# Patient Record
Sex: Male | Born: 1940 | Race: Black or African American | Hispanic: No | Smoking: Current every day smoker
Health system: Southern US, Community
[De-identification: ages and names within clinical notes are randomized; demographics above are authoritative.]

## PROBLEM LIST (undated history)

## (undated) DIAGNOSIS — I200 Unstable angina: Secondary | ICD-10-CM

## (undated) DIAGNOSIS — E785 Hyperlipidemia, unspecified: Secondary | ICD-10-CM

## (undated) DIAGNOSIS — Z9289 Personal history of other medical treatment: Secondary | ICD-10-CM

## (undated) DIAGNOSIS — K2211 Ulcer of esophagus with bleeding: Secondary | ICD-10-CM

## (undated) DIAGNOSIS — I10 Essential (primary) hypertension: Secondary | ICD-10-CM

## (undated) DIAGNOSIS — F17200 Nicotine dependence, unspecified, uncomplicated: Secondary | ICD-10-CM

## (undated) DIAGNOSIS — I25810 Atherosclerosis of coronary artery bypass graft(s) without angina pectoris: Secondary | ICD-10-CM

## (undated) DIAGNOSIS — C61 Malignant neoplasm of prostate: Secondary | ICD-10-CM

## (undated) DIAGNOSIS — N2581 Secondary hyperparathyroidism of renal origin: Secondary | ICD-10-CM

## (undated) DIAGNOSIS — J301 Allergic rhinitis due to pollen: Secondary | ICD-10-CM

## (undated) DIAGNOSIS — I739 Peripheral vascular disease, unspecified: Secondary | ICD-10-CM

## (undated) DIAGNOSIS — N183 Chronic kidney disease, stage 3 (moderate): Secondary | ICD-10-CM

## (undated) DIAGNOSIS — H9190 Unspecified hearing loss, unspecified ear: Secondary | ICD-10-CM

## (undated) HISTORY — DX: Unspecified hearing loss, unspecified ear: H91.90

## (undated) HISTORY — PX: CORONARY ARTERY BYPASS GRAFT: SHX141

## (undated) HISTORY — DX: Peripheral vascular disease, unspecified: I73.9

## (undated) HISTORY — DX: Hyperlipidemia, unspecified: E78.5

## (undated) HISTORY — DX: Malignant neoplasm of prostate: C61

## (undated) HISTORY — DX: Nicotine dependence, unspecified, uncomplicated: F17.200

## (undated) HISTORY — DX: Unstable angina: I20.0

## (undated) HISTORY — DX: Essential (primary) hypertension: I10

## (undated) HISTORY — DX: Secondary hyperparathyroidism of renal origin: N25.81

## (undated) HISTORY — DX: Atherosclerosis of coronary artery bypass graft(s) without angina pectoris: I25.810

## (undated) HISTORY — DX: Personal history of other medical treatment: Z92.89

## (undated) HISTORY — DX: Chronic kidney disease, stage 3 (moderate): N18.3

## (undated) HISTORY — DX: Ulcer of esophagus with bleeding: K22.11

## (undated) HISTORY — DX: Allergic rhinitis due to pollen: J30.1

---

## 2004-07-03 ENCOUNTER — Ambulatory Visit: Payer: Self-pay | Admitting: Family Medicine

## 2004-12-26 ENCOUNTER — Ambulatory Visit: Payer: Self-pay | Admitting: Family Medicine

## 2005-01-10 ENCOUNTER — Ambulatory Visit: Payer: Self-pay | Admitting: Family Medicine

## 2005-01-11 ENCOUNTER — Ambulatory Visit: Payer: Self-pay | Admitting: Family Medicine

## 2005-01-21 ENCOUNTER — Ambulatory Visit: Payer: Self-pay | Admitting: Family Medicine

## 2005-01-30 ENCOUNTER — Ambulatory Visit: Payer: Self-pay | Admitting: Family Medicine

## 2005-02-05 ENCOUNTER — Ambulatory Visit (HOSPITAL_COMMUNITY): Admission: RE | Admit: 2005-02-05 | Discharge: 2005-02-05 | Payer: Self-pay | Admitting: Vascular Surgery

## 2005-03-25 ENCOUNTER — Ambulatory Visit: Payer: Self-pay | Admitting: Sports Medicine

## 2005-09-03 ENCOUNTER — Ambulatory Visit: Payer: Self-pay | Admitting: Family Medicine

## 2006-07-24 DIAGNOSIS — E7800 Pure hypercholesterolemia, unspecified: Secondary | ICD-10-CM

## 2006-07-24 DIAGNOSIS — N1832 Chronic kidney disease, stage 3b: Secondary | ICD-10-CM | POA: Insufficient documentation

## 2006-07-24 DIAGNOSIS — N1831 Chronic kidney disease, stage 3a: Secondary | ICD-10-CM | POA: Insufficient documentation

## 2006-07-24 DIAGNOSIS — N183 Chronic kidney disease, stage 3 unspecified: Secondary | ICD-10-CM

## 2006-07-24 DIAGNOSIS — I10 Essential (primary) hypertension: Secondary | ICD-10-CM | POA: Insufficient documentation

## 2006-07-24 HISTORY — DX: Essential (primary) hypertension: I10

## 2006-07-24 HISTORY — DX: Chronic kidney disease, stage 3 unspecified: N18.30

## 2006-10-09 ENCOUNTER — Encounter: Payer: Self-pay | Admitting: Family Medicine

## 2006-11-14 ENCOUNTER — Encounter (INDEPENDENT_AMBULATORY_CARE_PROVIDER_SITE_OTHER): Payer: Self-pay | Admitting: Family Medicine

## 2007-05-04 ENCOUNTER — Ambulatory Visit: Payer: Self-pay | Admitting: Sports Medicine

## 2007-05-04 ENCOUNTER — Encounter (INDEPENDENT_AMBULATORY_CARE_PROVIDER_SITE_OTHER): Payer: Self-pay | Admitting: Family Medicine

## 2007-05-04 DIAGNOSIS — N2581 Secondary hyperparathyroidism of renal origin: Secondary | ICD-10-CM | POA: Insufficient documentation

## 2007-05-04 HISTORY — DX: Secondary hyperparathyroidism of renal origin: N25.81

## 2007-05-04 LAB — CONVERTED CEMR LAB
AST: 10 units/L (ref 0–37)
Alkaline Phosphatase: 121 units/L — ABNORMAL HIGH (ref 39–117)
Basophils Relative: 0 % (ref 0–1)
Bilirubin Urine: NEGATIVE
Chloride: 103 meq/L (ref 96–112)
Creatinine, Ser: 1.48 mg/dL (ref 0.40–1.50)
Creatinine, Urine: 143.7 mg/dL
Eosinophils Absolute: 0.2 10*3/uL (ref 0.2–0.7)
Eosinophils Relative: 3 % (ref 0–5)
Ferritin: 432 ng/mL — ABNORMAL HIGH (ref 22–322)
Glucose, Bld: 80 mg/dL (ref 70–99)
HCT: 43.9 % (ref 39.0–52.0)
Hemoglobin, Urine: NEGATIVE
Ketones, ur: NEGATIVE mg/dL
MCHC: 33 g/dL (ref 30.0–36.0)
Monocytes Absolute: 0.6 10*3/uL (ref 0.1–1.0)
Neutro Abs: 2.8 10*3/uL (ref 1.7–7.7)
Potassium: 4.9 meq/L (ref 3.5–5.3)
Protein, ur: NEGATIVE mg/dL
RBC: 4.62 M/uL (ref 4.22–5.81)
Saturation Ratios: 29 % (ref 20–55)
Sodium: 139 meq/L (ref 135–145)
Total Protein: 7.1 g/dL (ref 6.0–8.3)
Urine Glucose: NEGATIVE mg/dL
WBC: 5.4 10*3/uL (ref 4.0–10.5)

## 2007-05-14 ENCOUNTER — Encounter (INDEPENDENT_AMBULATORY_CARE_PROVIDER_SITE_OTHER): Payer: Self-pay | Admitting: Family Medicine

## 2007-05-26 ENCOUNTER — Ambulatory Visit: Payer: Self-pay | Admitting: Family Medicine

## 2007-05-26 ENCOUNTER — Encounter (INDEPENDENT_AMBULATORY_CARE_PROVIDER_SITE_OTHER): Payer: Self-pay | Admitting: Family Medicine

## 2007-05-26 LAB — CONVERTED CEMR LAB
Cholesterol: 176 mg/dL (ref 0–200)
LDL Cholesterol: 103 mg/dL — ABNORMAL HIGH (ref 0–99)
TSH: 1.298 microintl units/mL (ref 0.350–5.50)
Total CHOL/HDL Ratio: 3
VLDL: 15 mg/dL (ref 0–40)

## 2007-05-27 ENCOUNTER — Encounter (INDEPENDENT_AMBULATORY_CARE_PROVIDER_SITE_OTHER): Payer: Self-pay | Admitting: Family Medicine

## 2007-05-28 HISTORY — PX: INGUINAL HERNIA REPAIR: SUR1180

## 2007-06-01 ENCOUNTER — Encounter (INDEPENDENT_AMBULATORY_CARE_PROVIDER_SITE_OTHER): Payer: Self-pay | Admitting: Family Medicine

## 2007-08-10 ENCOUNTER — Ambulatory Visit: Payer: Self-pay | Admitting: Family Medicine

## 2007-08-10 DIAGNOSIS — F17200 Nicotine dependence, unspecified, uncomplicated: Secondary | ICD-10-CM | POA: Insufficient documentation

## 2007-08-10 HISTORY — DX: Nicotine dependence, unspecified, uncomplicated: F17.200

## 2007-08-13 ENCOUNTER — Encounter: Admission: RE | Admit: 2007-08-13 | Discharge: 2007-08-13 | Payer: Self-pay | Admitting: Family Medicine

## 2007-08-13 ENCOUNTER — Encounter (INDEPENDENT_AMBULATORY_CARE_PROVIDER_SITE_OTHER): Payer: Self-pay | Admitting: Family Medicine

## 2007-08-20 ENCOUNTER — Encounter (INDEPENDENT_AMBULATORY_CARE_PROVIDER_SITE_OTHER): Payer: Self-pay | Admitting: Family Medicine

## 2007-09-09 ENCOUNTER — Ambulatory Visit: Payer: Self-pay | Admitting: Family Medicine

## 2007-09-09 ENCOUNTER — Encounter (INDEPENDENT_AMBULATORY_CARE_PROVIDER_SITE_OTHER): Payer: Self-pay | Admitting: Family Medicine

## 2007-09-09 LAB — CONVERTED CEMR LAB
Bilirubin Urine: NEGATIVE
Glucose, Urine, Semiquant: NEGATIVE
Ketones, urine, test strip: NEGATIVE
Nitrite: NEGATIVE
Protein, U semiquant: 30
Urobilinogen, UA: 1
WBC Urine, dipstick: NEGATIVE

## 2007-09-21 ENCOUNTER — Encounter (INDEPENDENT_AMBULATORY_CARE_PROVIDER_SITE_OTHER): Payer: Self-pay | Admitting: Family Medicine

## 2007-09-21 LAB — CONVERTED CEMR LAB
ALT: 9 units/L (ref 0–53)
AST: 11 units/L (ref 0–37)
Albumin: 4 g/dL (ref 3.5–5.2)
Alkaline Phosphatase: 109 units/L (ref 39–117)
BUN: 14 mg/dL (ref 6–23)
Calcium: 9 mg/dL (ref 8.4–10.5)
Chloride: 106 meq/L (ref 96–112)
Creatinine, Urine: 243.4 mg/dL
Direct LDL: 103 mg/dL — ABNORMAL HIGH
Eosinophils Relative: 2 % (ref 0–5)
Glucose, Bld: 79 mg/dL (ref 70–99)
Lymphocytes Relative: 19 % (ref 12–46)
Lymphs Abs: 1.3 10*3/uL (ref 0.7–4.0)
MCHC: 33.2 g/dL (ref 30.0–36.0)
Neutrophils Relative %: 67 % (ref 43–77)
Phosphorus: 2.8 mg/dL (ref 2.3–4.6)
Potassium: 4.5 meq/L (ref 3.5–5.3)
RDW: 14 % (ref 11.5–15.5)
Saturation Ratios: 19 % — ABNORMAL LOW (ref 20–55)
Sodium: 139 meq/L (ref 135–145)
TIBC: 223 ug/dL (ref 215–435)
Total Protein, Urine: 9
UIBC: 180 ug/dL

## 2007-10-09 ENCOUNTER — Ambulatory Visit: Payer: Self-pay | Admitting: Family Medicine

## 2007-10-12 ENCOUNTER — Telehealth (INDEPENDENT_AMBULATORY_CARE_PROVIDER_SITE_OTHER): Payer: Self-pay | Admitting: Family Medicine

## 2007-11-12 ENCOUNTER — Encounter: Payer: Self-pay | Admitting: Family Medicine

## 2008-03-03 ENCOUNTER — Encounter: Payer: Self-pay | Admitting: Family Medicine

## 2008-03-03 ENCOUNTER — Ambulatory Visit: Payer: Self-pay | Admitting: Family Medicine

## 2008-03-03 LAB — CONVERTED CEMR LAB: Hemoglobin: 14.7 g/dL

## 2008-03-07 LAB — CONVERTED CEMR LAB
AST: 12 units/L (ref 0–37)
Alkaline Phosphatase: 123 units/L — ABNORMAL HIGH (ref 39–117)
Calcium, Total (PTH): 9.5 mg/dL (ref 8.4–10.5)
Chloride: 101 meq/L (ref 96–112)
Creatinine, Ser: 1.85 mg/dL — ABNORMAL HIGH (ref 0.40–1.50)
Glucose, Bld: 90 mg/dL (ref 70–99)
Potassium: 4.8 meq/L (ref 3.5–5.3)
Total Bilirubin: 0.9 mg/dL (ref 0.3–1.2)

## 2008-03-21 ENCOUNTER — Ambulatory Visit: Payer: Self-pay | Admitting: Family Medicine

## 2008-03-21 ENCOUNTER — Encounter: Payer: Self-pay | Admitting: Family Medicine

## 2008-03-21 LAB — CONVERTED CEMR LAB
AST: 12 units/L (ref 0–37)
CO2: 22 meq/L (ref 19–32)
Chloride: 105 meq/L (ref 96–112)
Creatinine, Ser: 1.47 mg/dL (ref 0.40–1.50)
Glucose, Bld: 83 mg/dL (ref 70–99)
Total Bilirubin: 0.6 mg/dL (ref 0.3–1.2)

## 2008-03-23 ENCOUNTER — Encounter: Payer: Self-pay | Admitting: Family Medicine

## 2008-03-28 ENCOUNTER — Telehealth: Payer: Self-pay | Admitting: *Deleted

## 2008-03-28 ENCOUNTER — Encounter: Admission: RE | Admit: 2008-03-28 | Discharge: 2008-03-28 | Payer: Self-pay | Admitting: General Surgery

## 2008-03-30 ENCOUNTER — Ambulatory Visit (HOSPITAL_BASED_OUTPATIENT_CLINIC_OR_DEPARTMENT_OTHER): Admission: RE | Admit: 2008-03-30 | Discharge: 2008-03-30 | Payer: Self-pay | Admitting: General Surgery

## 2008-04-01 ENCOUNTER — Inpatient Hospital Stay (HOSPITAL_COMMUNITY): Admission: EM | Admit: 2008-04-01 | Discharge: 2008-04-07 | Payer: Self-pay | Admitting: Emergency Medicine

## 2008-04-13 ENCOUNTER — Encounter: Payer: Self-pay | Admitting: Family Medicine

## 2008-04-20 ENCOUNTER — Ambulatory Visit: Payer: Self-pay | Admitting: Family Medicine

## 2008-04-20 ENCOUNTER — Encounter: Payer: Self-pay | Admitting: Family Medicine

## 2008-04-26 ENCOUNTER — Encounter (HOSPITAL_COMMUNITY): Admission: RE | Admit: 2008-04-26 | Discharge: 2008-05-26 | Payer: Self-pay | Admitting: Nephrology

## 2008-04-26 ENCOUNTER — Encounter: Admission: RE | Admit: 2008-04-26 | Discharge: 2008-04-26 | Payer: Self-pay | Admitting: Nephrology

## 2008-04-28 ENCOUNTER — Encounter: Payer: Self-pay | Admitting: Family Medicine

## 2008-05-27 DIAGNOSIS — Z9289 Personal history of other medical treatment: Secondary | ICD-10-CM

## 2008-05-27 HISTORY — DX: Personal history of other medical treatment: Z92.89

## 2008-05-27 HISTORY — PX: PROSTATECTOMY: SHX69

## 2008-06-10 ENCOUNTER — Encounter: Payer: Self-pay | Admitting: Family Medicine

## 2008-07-04 ENCOUNTER — Encounter: Payer: Self-pay | Admitting: Family Medicine

## 2008-07-19 ENCOUNTER — Encounter: Payer: Self-pay | Admitting: Family Medicine

## 2008-07-20 ENCOUNTER — Encounter (INDEPENDENT_AMBULATORY_CARE_PROVIDER_SITE_OTHER): Payer: Self-pay | Admitting: *Deleted

## 2008-08-04 ENCOUNTER — Ambulatory Visit: Payer: Self-pay | Admitting: Cardiology

## 2008-08-17 ENCOUNTER — Inpatient Hospital Stay (HOSPITAL_COMMUNITY): Admission: RE | Admit: 2008-08-17 | Discharge: 2008-08-27 | Payer: Self-pay | Admitting: Urology

## 2008-08-17 ENCOUNTER — Encounter (INDEPENDENT_AMBULATORY_CARE_PROVIDER_SITE_OTHER): Payer: Self-pay | Admitting: Urology

## 2008-08-24 ENCOUNTER — Encounter (INDEPENDENT_AMBULATORY_CARE_PROVIDER_SITE_OTHER): Payer: Self-pay | Admitting: Internal Medicine

## 2008-08-29 ENCOUNTER — Encounter: Payer: Self-pay | Admitting: Family Medicine

## 2008-09-08 ENCOUNTER — Encounter (HOSPITAL_COMMUNITY): Admission: RE | Admit: 2008-09-08 | Discharge: 2008-12-07 | Payer: Self-pay | Admitting: Nephrology

## 2008-09-29 ENCOUNTER — Telehealth: Payer: Self-pay | Admitting: Family Medicine

## 2008-10-03 ENCOUNTER — Encounter: Payer: Self-pay | Admitting: Family Medicine

## 2008-10-25 ENCOUNTER — Ambulatory Visit: Payer: Self-pay | Admitting: Family Medicine

## 2008-10-25 ENCOUNTER — Encounter: Payer: Self-pay | Admitting: Family Medicine

## 2008-10-25 DIAGNOSIS — K2211 Ulcer of esophagus with bleeding: Secondary | ICD-10-CM

## 2008-10-25 DIAGNOSIS — E785 Hyperlipidemia, unspecified: Secondary | ICD-10-CM | POA: Insufficient documentation

## 2008-10-25 DIAGNOSIS — C61 Malignant neoplasm of prostate: Secondary | ICD-10-CM

## 2008-10-25 HISTORY — DX: Ulcer of esophagus with bleeding: K22.11

## 2008-10-25 HISTORY — DX: Malignant neoplasm of prostate: C61

## 2008-10-31 ENCOUNTER — Telehealth: Payer: Self-pay | Admitting: Family Medicine

## 2008-11-09 ENCOUNTER — Encounter: Payer: Self-pay | Admitting: Family Medicine

## 2008-11-21 ENCOUNTER — Encounter: Payer: Self-pay | Admitting: Family Medicine

## 2009-01-03 ENCOUNTER — Telehealth: Payer: Self-pay | Admitting: Family Medicine

## 2009-01-23 ENCOUNTER — Telehealth: Payer: Self-pay | Admitting: Family Medicine

## 2009-01-24 ENCOUNTER — Ambulatory Visit: Payer: Self-pay | Admitting: Family Medicine

## 2009-02-21 ENCOUNTER — Encounter: Payer: Self-pay | Admitting: Family Medicine

## 2009-04-27 IMAGING — CR DG ABDOMEN ACUTE W/ 1V CHEST
4 series · 4 of 4 positions shown · non-contrast
Comparison: Chest film 03/28/2008.  Abdominal ultrasound 08/13/2007

CLINICAL DATA: Postop hernia repair in 1 day.  Vomiting.

ACUTE ABDOMEN SERIES (ABDOMEN 2 VIEW & CHEST 1 VIEW)

[w chest pa]
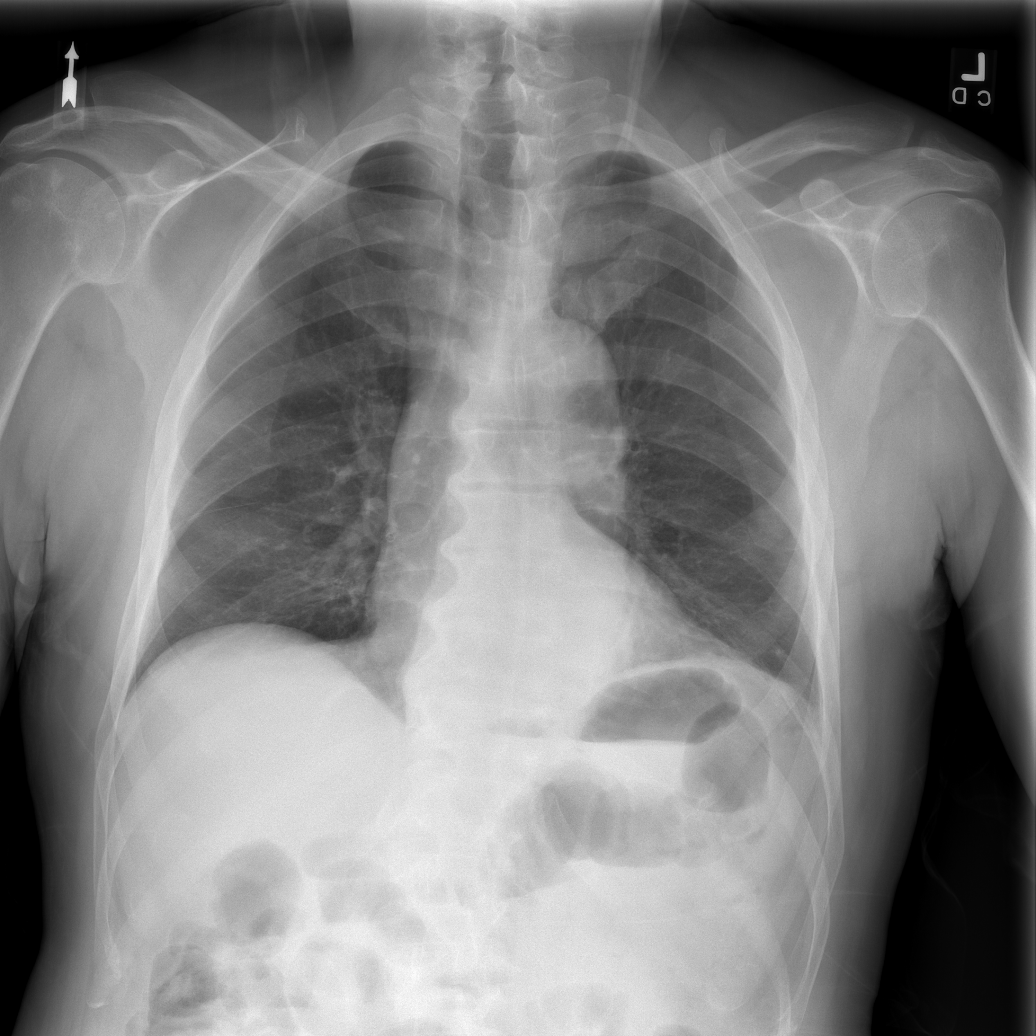

[w abdomen upright *]
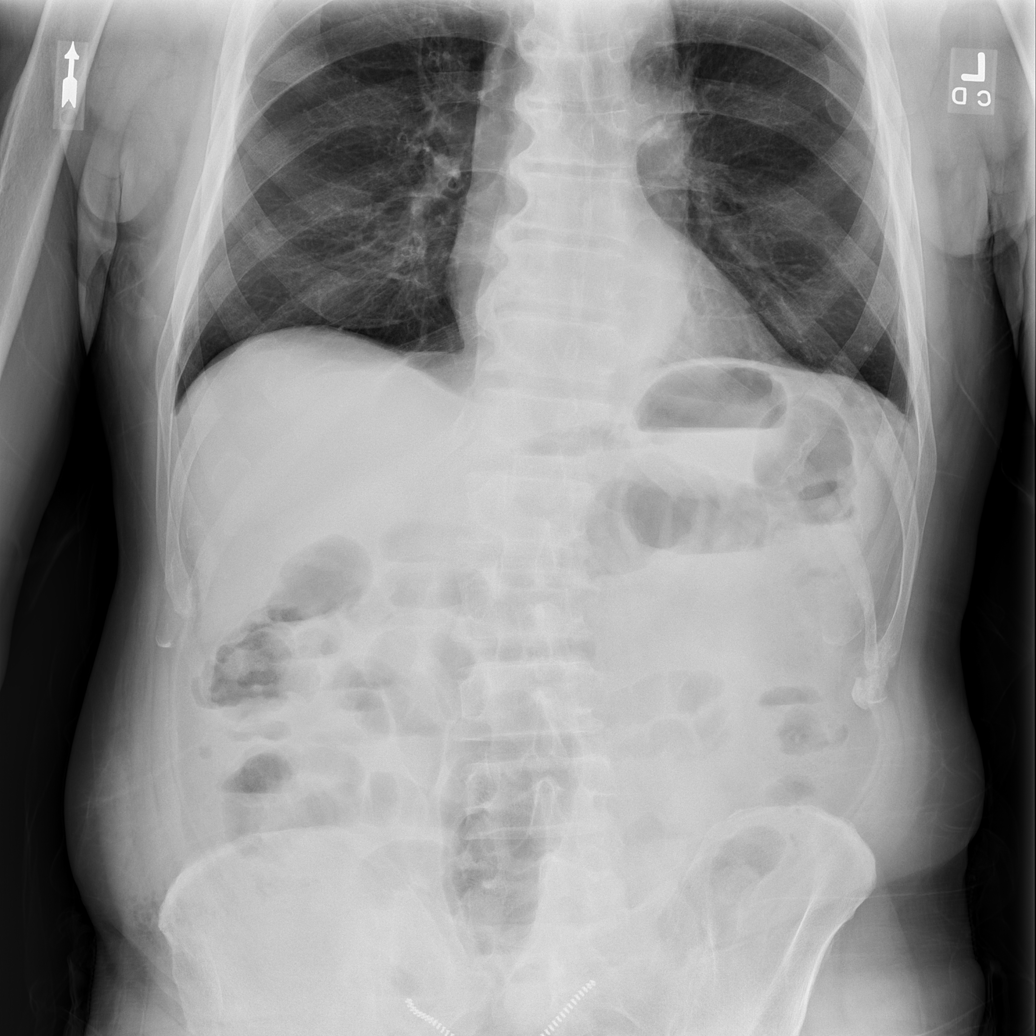

[t abdomen supine (1 of 2)]
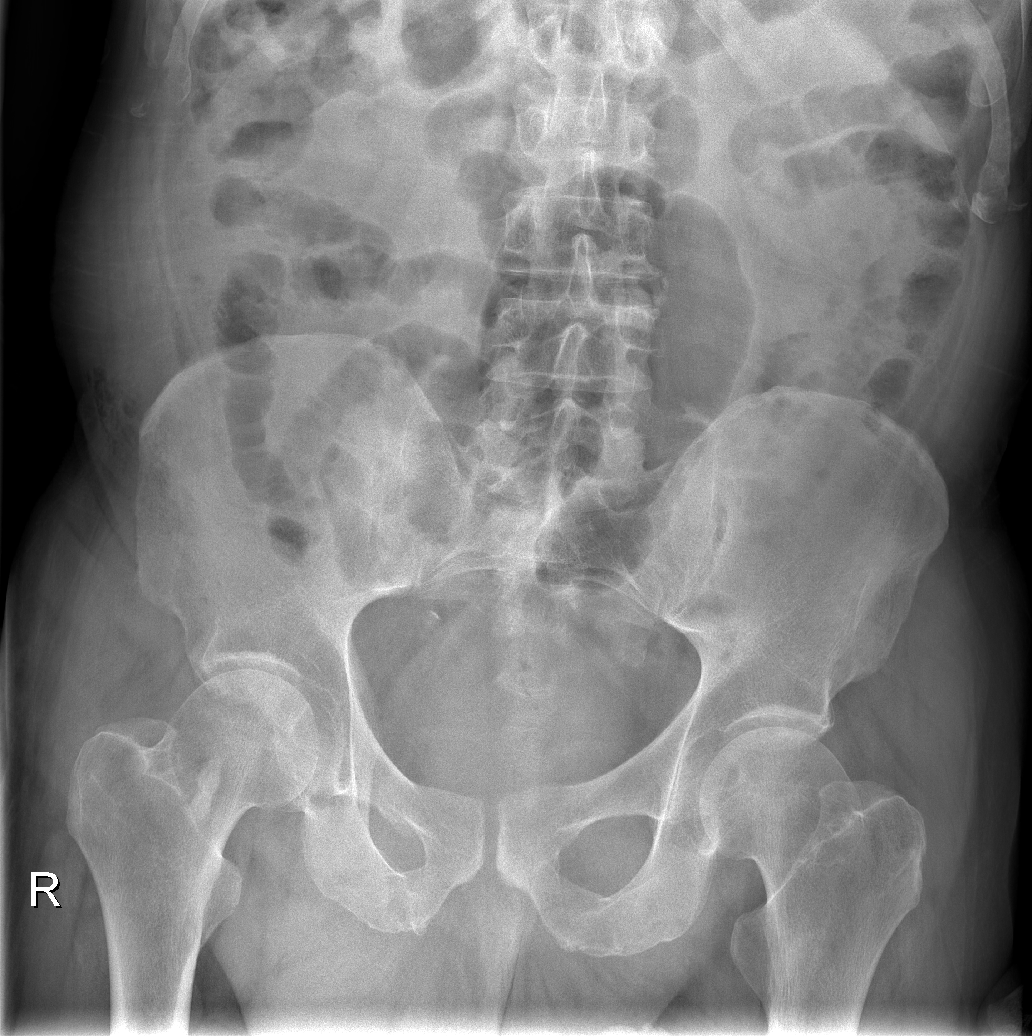

[t abdomen supine (2 of 2)]
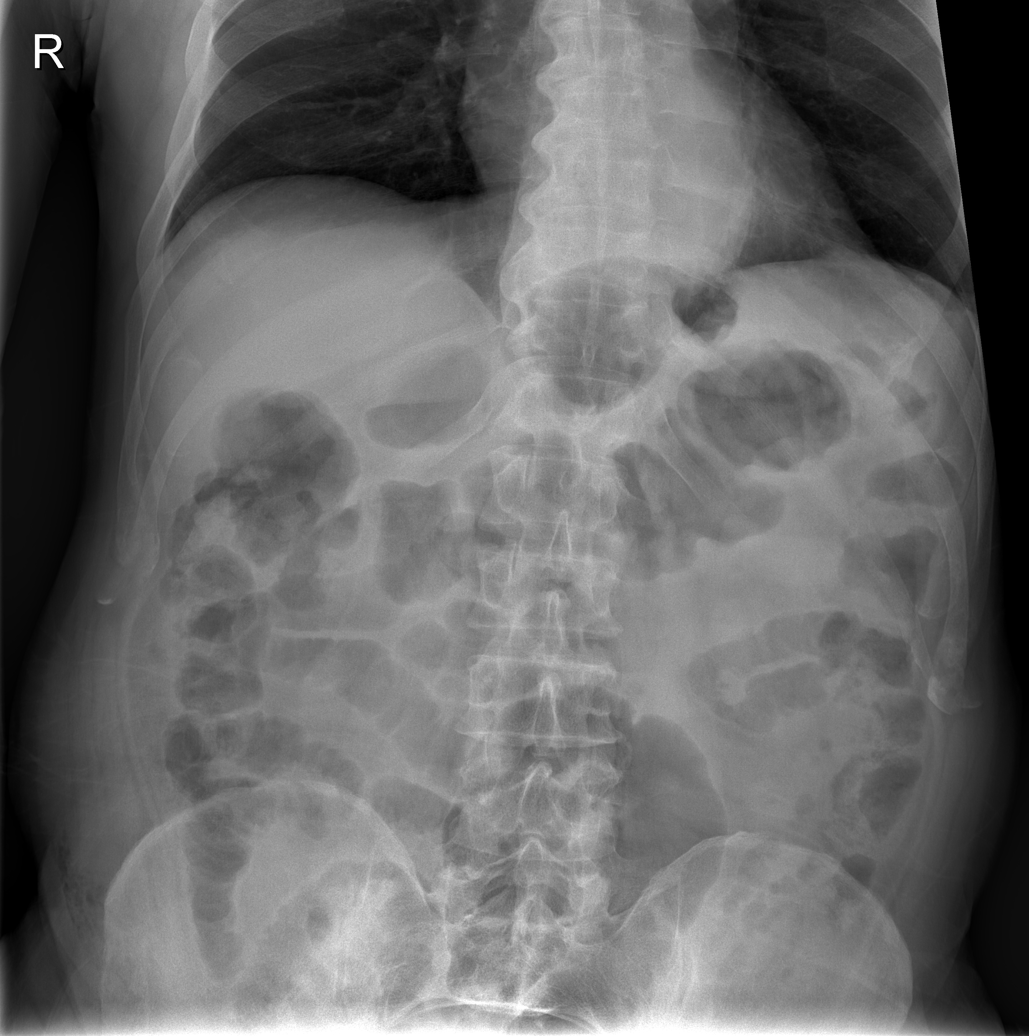

[4 of 4 positions shown; findings below may reference images not displayed]

FINDINGS: Frontal view of the chest demonstrates minimal patient
rotation to the right.  Mildly tortuous thoracic aorta.  Normal
heart size. No pleural effusion or pneumothorax. Lung volumes
mildly diminished with minimal left base atelectasis. 3 views of
the abdomen demonstrate no free intraperitoneal air.  Air fluid
levels throughout the mid abdomen.  Gas filled small bowel loops
measure up to 3.3 cm.  There is colonic gas identified.  No
abnormal abdominal calcifications.  No abnormal abdominal
calcifications.
IMPRESSION: 1. No acute cardiopulmonary disease.
2.  Air fluid levels with borderline/mild small bowel distention
and colonic gas.  Favor postoperative adynamic ileus.  Low grade
partial small bowel obstruction possible but felt less likely.

## 2009-05-02 ENCOUNTER — Encounter: Payer: Self-pay | Admitting: Family Medicine

## 2009-05-23 IMAGING — CR DG ABDOMEN ACUTE W/ 1V CHEST
3 series · 3 of 3 positions shown · non-contrast
Comparison: [HOSPITAL] abdominal pelvic CT 04/04/2008.

CLINICAL DATA: 1 week post inguinal herniorrhaphy with
constipation.

ACUTE ABDOMEN SERIES (ABDOMEN 2 VIEW & CHEST 1 VIEW)

[w chest pa]
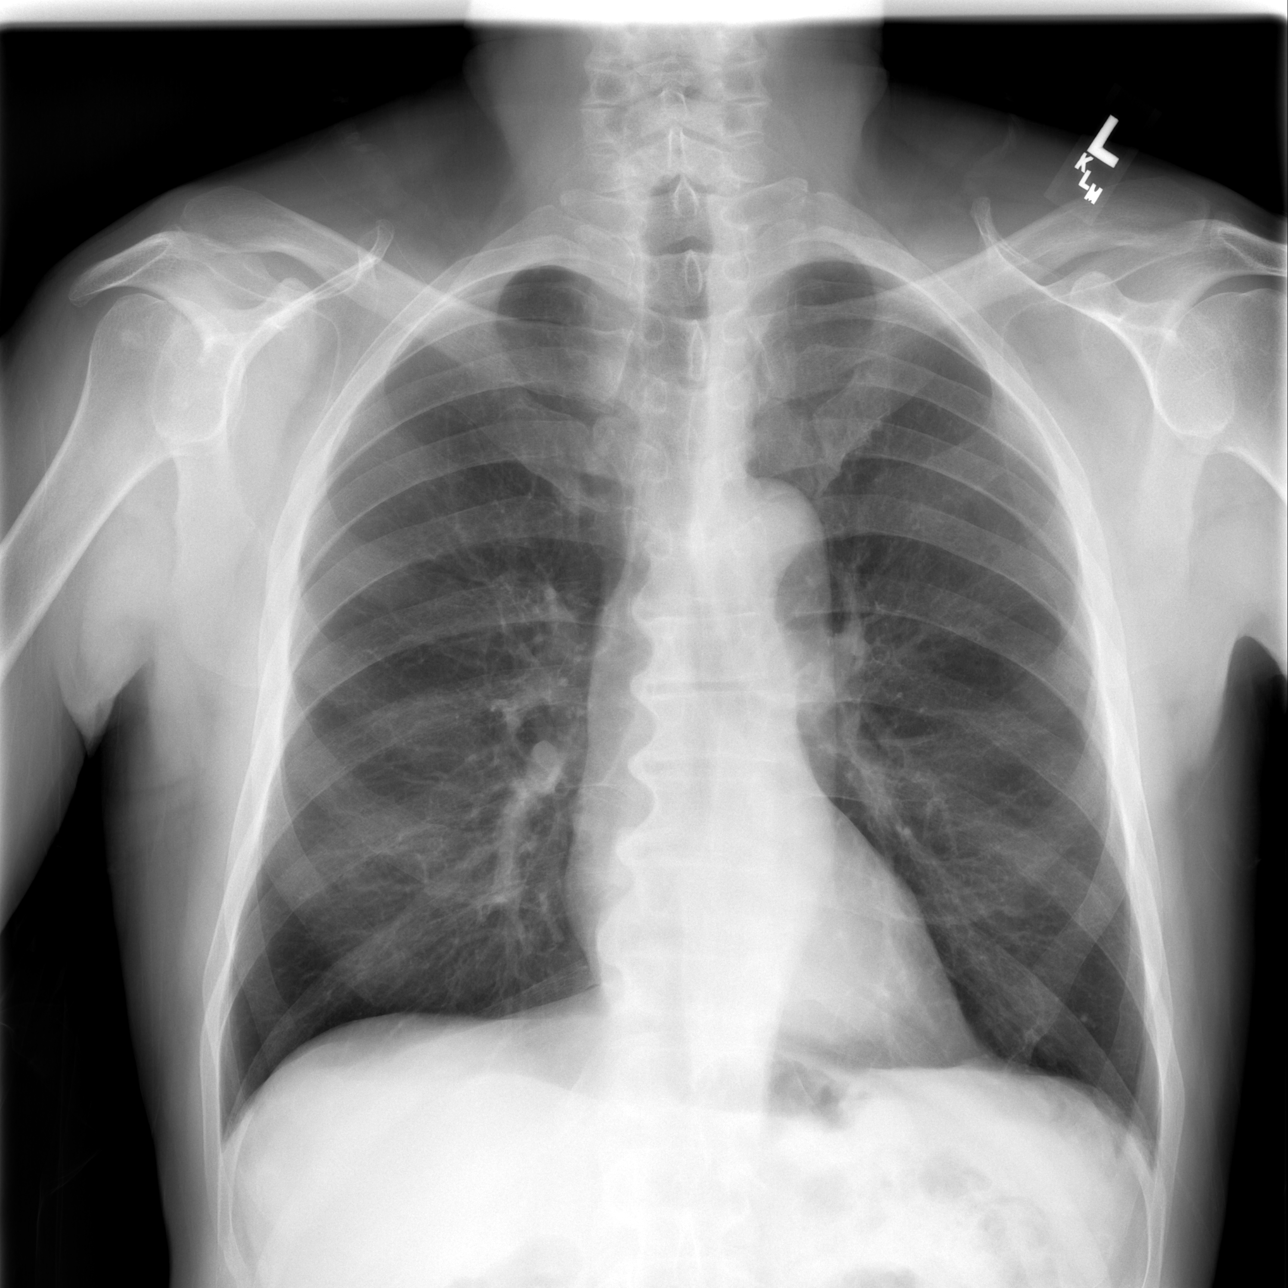

[w abdomen upright]
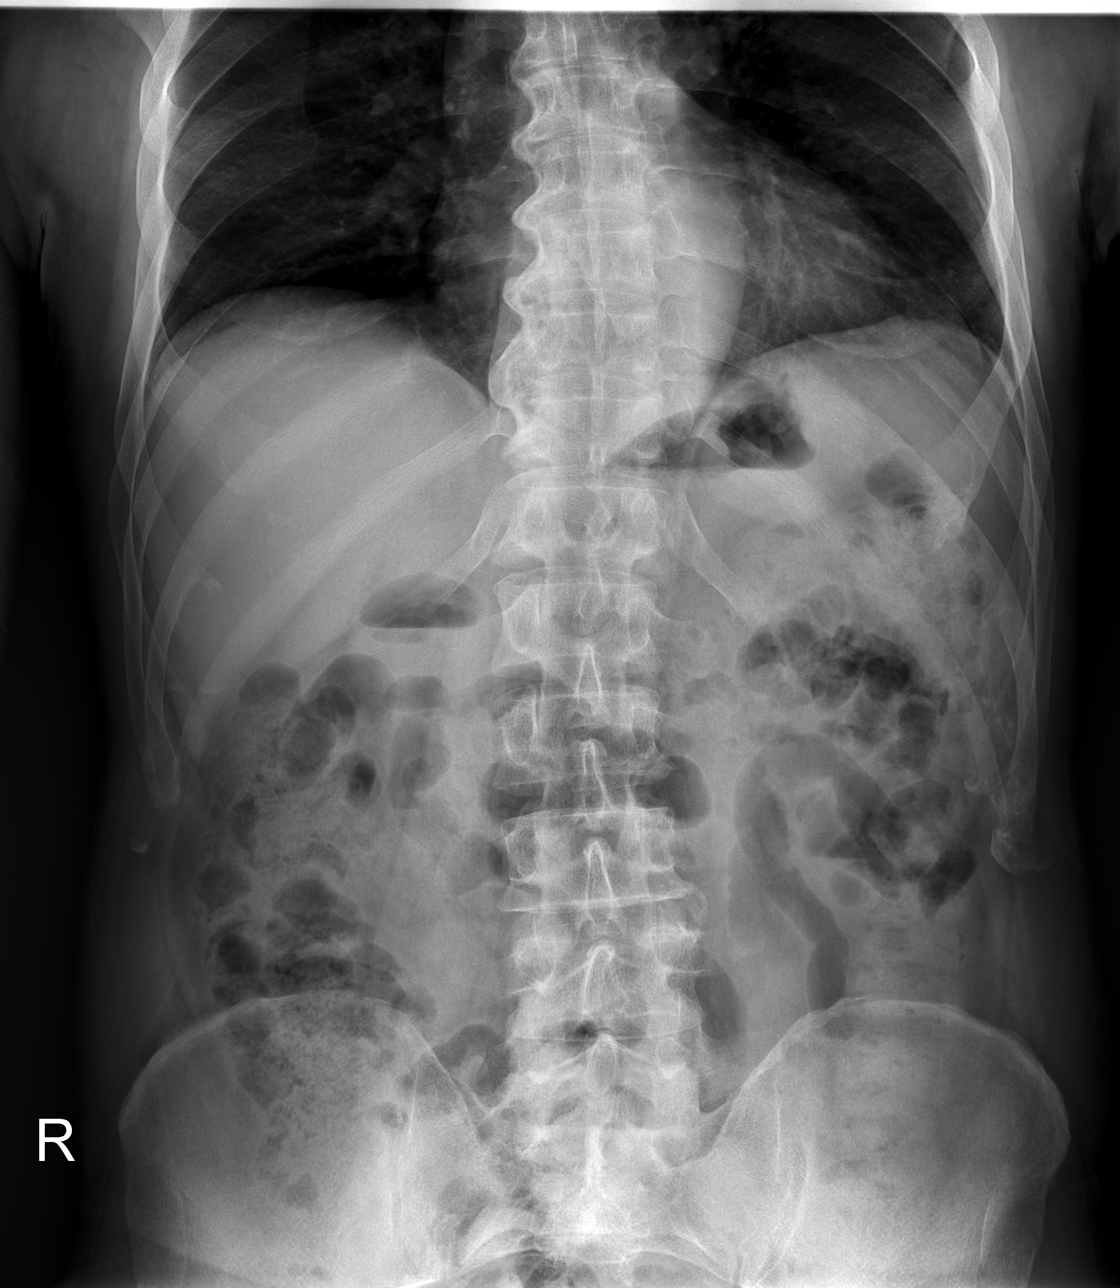

[t abdomen supine]
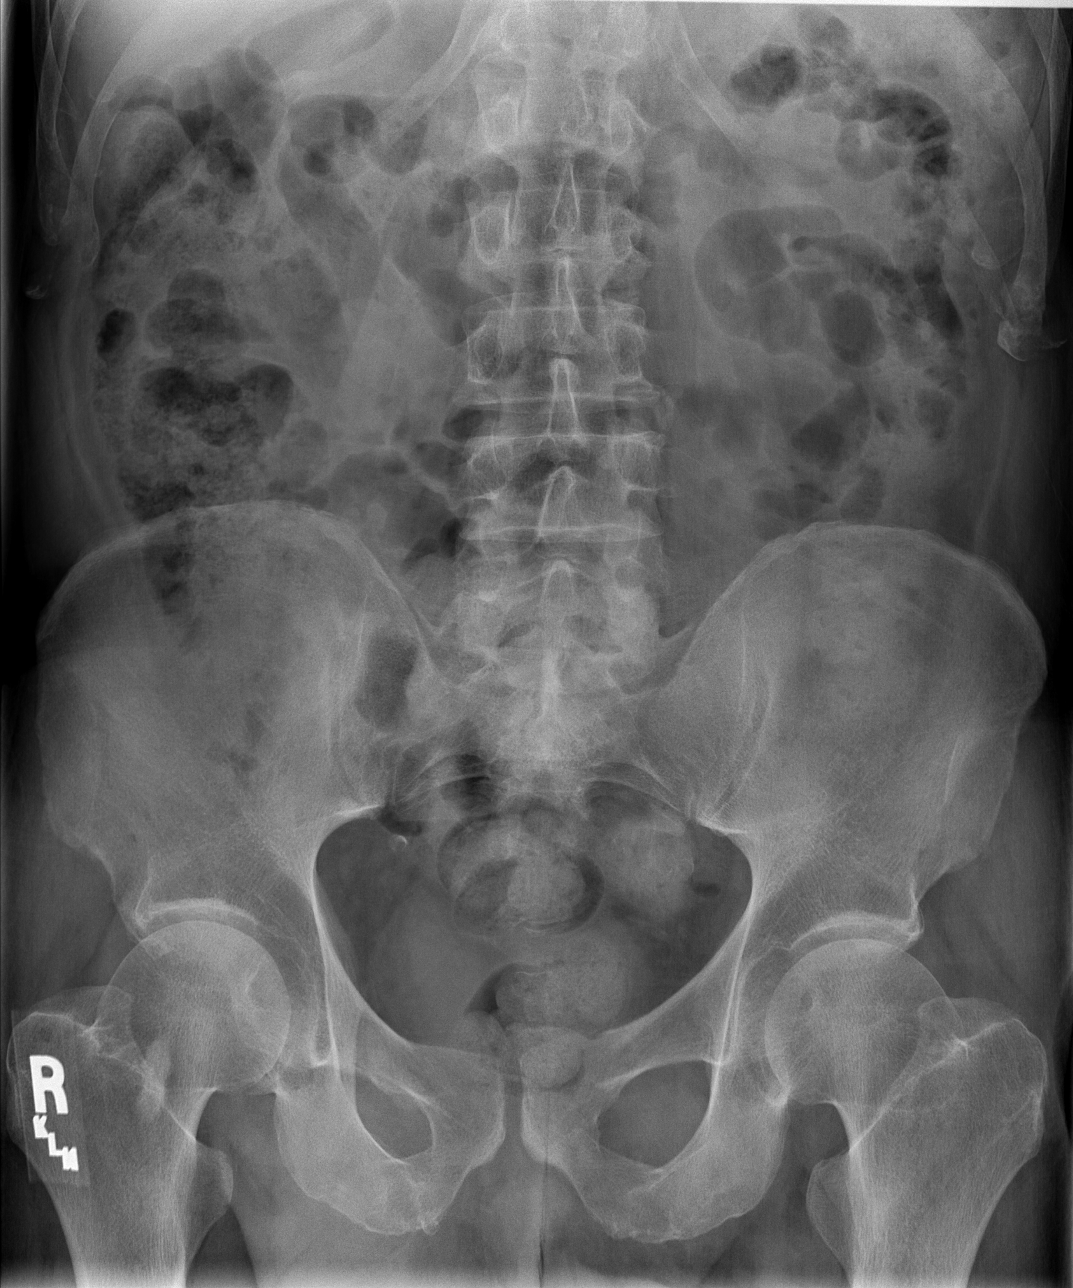

[3 of 3 positions shown; findings below may reference images not displayed]

FINDINGS: Lungs are clear with normal heart size without pneumo
peritoneum.  Nonspecific bowel gas pattern is seen without
significant ileus or obstruction.  Retained colonic feces
throughout the colon is consistent with constipation.  Approximate
2.5 cm long X 0.8 cm wide likely benign bone island is seen at the
right femoral neck.  No other significant abnormalities seen.
IMPRESSION: 1.  Retained colonic feces consistent with constipation without
significant ileus or obstructive bowel gas pattern.
2.  Otherwise, no significant abnormality.

## 2009-06-23 ENCOUNTER — Encounter: Payer: Self-pay | Admitting: Family Medicine

## 2009-07-15 ENCOUNTER — Ambulatory Visit: Payer: Self-pay | Admitting: Family Medicine

## 2009-07-15 ENCOUNTER — Inpatient Hospital Stay (HOSPITAL_COMMUNITY): Admission: EM | Admit: 2009-07-15 | Discharge: 2009-07-16 | Payer: Self-pay | Admitting: Emergency Medicine

## 2009-07-16 ENCOUNTER — Encounter: Payer: Self-pay | Admitting: Cardiology

## 2009-07-16 ENCOUNTER — Encounter: Payer: Self-pay | Admitting: Family Medicine

## 2009-07-16 LAB — CONVERTED CEMR LAB
Cholesterol: 173 mg/dL
Creatinine, Ser: 1.41 mg/dL
Glucose, Bld: 112 mg/dL
HDL: 69 mg/dL
Hgb A1c MFr Bld: 5.8 %
LDL Cholesterol: 96 mg/dL
Potassium: 3.7 meq/L
Sodium: 135 meq/L
Triglycerides: 39 mg/dL

## 2009-07-26 ENCOUNTER — Ambulatory Visit: Payer: Self-pay | Admitting: Family Medicine

## 2009-07-26 DIAGNOSIS — R0789 Other chest pain: Secondary | ICD-10-CM | POA: Insufficient documentation

## 2009-08-14 ENCOUNTER — Telehealth: Payer: Self-pay | Admitting: Family Medicine

## 2009-08-29 DIAGNOSIS — E785 Hyperlipidemia, unspecified: Secondary | ICD-10-CM

## 2009-08-29 HISTORY — DX: Hyperlipidemia, unspecified: E78.5

## 2009-08-30 ENCOUNTER — Ambulatory Visit: Payer: Self-pay | Admitting: Cardiology

## 2009-09-05 ENCOUNTER — Telehealth: Payer: Self-pay | Admitting: Cardiology

## 2009-09-05 ENCOUNTER — Telehealth (INDEPENDENT_AMBULATORY_CARE_PROVIDER_SITE_OTHER): Payer: Self-pay | Admitting: *Deleted

## 2009-09-28 ENCOUNTER — Ambulatory Visit: Payer: Self-pay

## 2009-09-28 ENCOUNTER — Ambulatory Visit: Payer: Self-pay | Admitting: Cardiology

## 2009-10-02 ENCOUNTER — Encounter: Payer: Self-pay | Admitting: Cardiology

## 2009-10-11 ENCOUNTER — Encounter: Payer: Self-pay | Admitting: Cardiology

## 2009-10-11 ENCOUNTER — Telehealth (INDEPENDENT_AMBULATORY_CARE_PROVIDER_SITE_OTHER): Payer: Self-pay | Admitting: *Deleted

## 2009-10-12 ENCOUNTER — Ambulatory Visit: Payer: Self-pay | Admitting: Internal Medicine

## 2009-10-12 ENCOUNTER — Encounter (HOSPITAL_COMMUNITY): Admission: RE | Admit: 2009-10-12 | Discharge: 2009-12-26 | Payer: Self-pay | Admitting: Cardiovascular Disease

## 2009-10-12 ENCOUNTER — Ambulatory Visit: Payer: Self-pay

## 2009-10-16 ENCOUNTER — Ambulatory Visit: Payer: Self-pay | Admitting: Family Medicine

## 2009-10-16 DIAGNOSIS — J301 Allergic rhinitis due to pollen: Secondary | ICD-10-CM | POA: Insufficient documentation

## 2009-10-16 HISTORY — DX: Allergic rhinitis due to pollen: J30.1

## 2009-10-18 ENCOUNTER — Ambulatory Visit: Payer: Self-pay | Admitting: Cardiology

## 2009-10-20 ENCOUNTER — Ambulatory Visit: Payer: Self-pay | Admitting: Cardiology

## 2009-10-20 ENCOUNTER — Encounter: Payer: Self-pay | Admitting: Family Medicine

## 2009-10-20 DIAGNOSIS — I25810 Atherosclerosis of coronary artery bypass graft(s) without angina pectoris: Secondary | ICD-10-CM | POA: Insufficient documentation

## 2009-10-20 HISTORY — DX: Atherosclerosis of coronary artery bypass graft(s) without angina pectoris: I25.810

## 2009-10-24 LAB — CONVERTED CEMR LAB
ALT: 9 units/L (ref 0–53)
AST: 13 units/L (ref 0–37)
Albumin: 4 g/dL (ref 3.5–5.2)
Cholesterol: 189 mg/dL (ref 0–200)
HDL: 63.3 mg/dL (ref >39.00)
Total CHOL/HDL Ratio: 3
Total Protein: 7.1 g/dL (ref 6.0–8.3)
Triglycerides: 67 mg/dL (ref 0.0–149.0)

## 2009-11-06 ENCOUNTER — Encounter: Payer: Self-pay | Admitting: Cardiology

## 2009-11-06 ENCOUNTER — Encounter: Payer: Self-pay | Admitting: Family Medicine

## 2010-03-08 ENCOUNTER — Encounter: Payer: Self-pay | Admitting: Family Medicine

## 2010-05-09 ENCOUNTER — Encounter: Payer: Self-pay | Admitting: Cardiology

## 2010-05-09 ENCOUNTER — Ambulatory Visit: Payer: Self-pay | Admitting: Cardiology

## 2010-06-12 ENCOUNTER — Encounter: Payer: Self-pay | Admitting: Cardiology

## 2010-06-12 ENCOUNTER — Encounter: Payer: Self-pay | Admitting: Family Medicine

## 2010-06-26 NOTE — Consult Note (Signed)
Summary: [facility]   Imported By: Raymond Gurney 11/24/2009 11:01:51  _____________________________________________________________________  External Attachment:    Type:   Image     Comment:   External Document

## 2010-06-26 NOTE — Assessment & Plan Note (Signed)
Summary: np6/atypical chest pain   Primary Provider:  Vic Blackbird MD  CC:  new patient.  Chest pain lasting one day per patient.  Pt kept overnight..  History of Present Illness: 70 yo with history of CKD and HTN presents for evaluation of chest pain.  Patient was hospitalized in 2/11 with a chest pain episode.  He was sitting on the sofa and developed a severe ache in his central chest that lasted severe hours.  He went to the ER and the pain resolved with morphine.  Cardiac enzymes were negative and he was discharged home the next day.  He has had no chest pain since and had no chest pain prior.  The pain was not associated with exertion or with eating.  He is able to climb steps without shortness of breath. He occasionally walks for exercise.    ECG: NSR, normal  Current Medications (verified): 1)  Simvastatin 20 Mg Tabs (Simvastatin) .... Take 1 Tablet By Mouth Every Night 2)  Omeprazole 20 Mg Cpdr (Omeprazole) .... Take 2 Tabs By Mouth Daily 3)  Amlodipine Besylate 10 Mg Tabs (Amlodipine Besylate) .[name] Kitchen.. 1 By Mouth Daily 4)  Metoprolol Tartrate 25 Mg Tabs (Metoprolol Tartrate) .[name] Kitchen.. 1 By Mouth Two Times A Day 5)  Nitrostat 0.4 Mg Subl (Nitroglycerin) .... As Directed 6)  Aspirin 81 Mg Tabs (Aspirin) .... Take One Tablet Once Daily  Allergies (verified): No Known Drug Allergies  Past History:  Past Medical History: 1. Prostate cancer: robotic prostectomy (3/10), acute blood loss s/p transfusion 2. CKD: Thought to be due to HTN.  Creatinine has ranged 1.4-1.8.  3. HTN 4. Inguinal hernia s/p repair 03/2008 5. hypercholsterolemia 6. esophageal ulcer 2010  Family History: h/o HTN, 1 brother, 5 sisters, father- age 32 `healthy`, grandfather with prostate CA (died of comorbid illness), mother dead at 87 w/ kidney DZ  No premature CAD.   Social History: Reviewed history from 07/24/2006 and no changes required. Single, from [place], [place].  Likes to walk and watch TV.  Smokes a  pipe occ.  No EtoH, no drugs.  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  Review of Systems       All systems reviewed and negative except as per HPI.   Vital Signs:  Patient profile:   70 year old male Height:      64 inches Weight:      175 pounds BMI:     30.15 Pulse rate:   69 / minute Pulse rhythm:   regular BP sitting:   124 / 78  (left arm) Cuff size:   regular  Vitals Entered By: Doug Sou CMA (August 30, 2009 3:39 PM)  Physical Exam  General:  Well developed, well nourished, in no acute distress. Head:  normocephalic and atraumatic Nose:  no deformity, discharge, inflammation, or lesions Mouth:  Teeth, gums and palate normal. Oral mucosa normal. Neck:  Neck supple, no JVD. No masses, thyromegaly or abnormal cervical nodes. Lungs:  Clear bilaterally to auscultation and percussion. Heart:  Non-displaced PMI, chest non-tender; regular rate and rhythm, S1, S2 without murmurs, rubs or gallops. Carotid upstroke normal, no bruit.  Pedals normal pulses. No edema, no varicosities. Abdomen:  Bowel sounds positive; abdomen soft and non-tender without masses, organomegaly, or hernias noted. No hepatosplenomegaly. Msk:  Back normal, normal gait. Muscle strength and tone normal. Extremities:  No clubbing or cyanosis. Neurologic:  Alert and oriented x 3. Skin:  Intact without lesions or rashes. Psych:  Normal affect.   Impression & Recommendations:  Problem # 1:  CHEST PAIN, ATYPICAL (ICD-786.59) Atypical chest pain in a patient with several risk factors (CKD, HTN, hyperlipidemia).  I will set him up for an ETT-myoview for risk stratification.    Problem # 2:  HYPERLIPIDEMIA (VDI-718.4) We will get a copy of the patient's lipids from Dr. Elissa Hefty office.   Other Orders: EKG w/ Interpretation (93000) Nuclear Stress Test (Nuc Stress Test)  Patient Instructions: 1)  Your physician has requested that you have an exercise stress myoview.  For further  information please visit [email or web address].  Please follow instruction sheet, as given. 2)  Your physician recommends that you schedule a follow-up appointment as needed with Dr Aundra Dubin.  Appended Document: np6/atypical chest pain unable to obtain authorization for stress myoview--reviewed with Dr Consuelo Pandy cancel stress myoview and schedule stress echo--daughter Lewie Chamber will discuss with pt-   Clinical Lists Changes  Orders: Added new Referral order of Stress Echo (Stress Echo) - Signed

## 2010-06-26 NOTE — Progress Notes (Signed)
Summary: Nuclear Pre-Procedure  Phone Note Outgoing Call   Call placed by: Perrin Maltese, EMT-P,  Oct 11, 2009 5:07 PM Summary of Call: Left message with information on Myoview Information Sheet (see scanned document for details).      Nuclear Med Background Indications for Stress Test: Evaluation for Ischemia, Post Hospital  Indications Comments: 07/15/09 Chest Pain [facility]): (-) enzymes  History: Echo, GXT  History Comments: 09/28/09 GXT Indeterminate Due to NS ST T changes and poor exercise tolerance 08/24/09 Echo: EF=60%  Symptoms: Chest Pain    Nuclear Pre-Procedure Cardiac Risk Factors: Hypertension, Lipids, Smoker Height (in): 74  Nuclear Med Study Referring MD:  D.McLean

## 2010-06-26 NOTE — Assessment & Plan Note (Signed)
Summary: Cardiology Nuclear Study  Nuclear Med Background Indications for Stress Test: Evaluation for Ischemia, [facility]  Indications Comments: 07/15/09 BWL:SLHTD Pain, negative enzymes  History: Echo, GXT  History Comments: 09/28/09 GXT Indeterminate d/t NS ST-T changes and poor exercise tolerance; 3/11 Echo:EF+60%  Symptoms: Chest Tightness  Symptoms Comments: Last episode of SK:AJGO since discharge.   Nuclear Pre-Procedure Cardiac Risk Factors: Hypertension, Lipids, Smoker Caffeine/Decaff Intake: None NPO After: 6:00 PM Lungs: Clear.  O2 Sat 97% on RA. IV 0.9% NS with Angio Cath: 20g     IV Site: (R) AC IV Started by: Eliezer Lofts EMT-P Chest Size (in) 42     Height (in): 69 Weight (lb): 174 BMI: 25.79 Tech Comments: Smoking consists of occ. pipe. Metoprolol held this am,  per patient.  Nuclear Med Study 1 or 2 day study:  1 day     Stress Test Type:  Carlton Adam Reading MD:  Glori Bickers, MD     Referring MD:  Loralie Champagne, MD Resting Radionuclide:  Technetium 72m Tetrofosmin     Resting Radionuclide Dose:  10.0 mCi  Stress Radionuclide:  Technetium 81m Tetrofosmin     Stress Radionuclide Dose:  33.0 mCi   Stress Protocol   Lexiscan: 0.4 mg   Stress Test Technologist:  Valetta Fuller CMA-N     Nuclear Technologist:  Charlton Amor CNMT  Rest Procedure  Myocardial perfusion imaging was performed at rest 45 minutes following the intravenous administration of Myoview Technetium 75m Tetrofosmin.  Stress Procedure  The patient received IV Lexiscan 0.4 mg over 15-seconds.  Myoview injected at 30-seconds.  There were more diffuse T-wave changes with lexiscan.  Quantitative spect images were obtained after a 45 minute delay.  QPS Raw Data Images:  There is interference from nuclear activity from structures below the diaphragm Stress Images:  Decreased uptake in the inferior wall. Rest Images:  Decreased uptake in the inferior wall. Subtraction (SDS):  There is  decreased uptake in the inferior wall on the rest images which is worse with stress suggestive of previous infarct with mild peri-infarct ischemia. However, there is significant interference from uptake below the diaphragm and the perfusion defects may be artifactual. Transient Ischemic Dilatation:  .99  (Normal <1.22)  Lung/Heart Ratio:  .28  (Normal <0.45)  Quantitative Gated Spect Images QGS EDV:  98 ml QGS ESV:  42 ml QGS EF:  57 % QGS cine images:  Normal  Findings Abnormal nuclear study      Overall Impression  Exercise Capacity: [place] study with no exercise. ECG Impression: Baseline: NSR; No significant ST segment change with Lexiscan. Overall Impression: Abnormal stress nuclear study. Overall Impression Comments: There is decreased uptake in the inferior wall on the rest images which is worse with stress suggestive of previous infarct with mild peri-infarct ischemia. However, there is significant interference from uptake below the diaphragm and the perfusion defects may be artifactual.  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  Would like to avoid cath due to CKD.  Is he having any chest pain? Need to followup in office.   Appended Document: Cardiology Nuclear Study 10/13/09--10am--spoke with pt's daughter and gave results of stress test --advised to make f/p appoint with dr mclean--daughter agrees--nt  Appended Document: Cardiology Nuclear Study appt 10-18-09 with Dr Aundra Dubin

## 2010-06-26 NOTE — Progress Notes (Signed)
Summary: Nuclear Pre-Procedure  Phone Note Outgoing Call Call back at [facility] Phone [phone]   Call placed by: Eliezer Lofts, EMT-P,  September 05, 2009 2:10 PM Action Taken: Phone Call Completed Summary of Call: Reviewed information on Myoview Information Sheet (see scanned document for further details).  Spoke with Patient's daughter, Matthew [name].    Nuclear Med Background Indications for Stress Test: Evaluation for Ischemia, Post Hospital  Indications Comments: 07/15/09 Chest Pain [facility]): (-) enzymes  History: Echo  History Comments: 08/24/09 Echo: EF=60%  Symptoms: Chest Pain    Nuclear Pre-Procedure Cardiac Risk Factors: Hypertension, Lipids, Smoker Height (in): 64

## 2010-06-26 NOTE — Progress Notes (Signed)
Summary: meds prob  Phone Note Call from Patient Call back at Home Phone [phone]   Caller: daughter-Ms Richardson Landry Summary of Call: pharm gave him $Remove'20mg'isqJgmB$  the beginning of March for some reason and now he is out(he had to take 2 per day) and they cannot give anymore b/c ins won't pay for more this month.  not sure what to do at this point. Initial call taken by: Audie Clear,  August 14, 2009 3:48 PM  Follow-up for Phone Call        to pcp Follow-up by: Elige Radon RN,  August 14, 2009 3:59 PM    New/Updated Medications: OMEPRAZOLE 20 MG CPDR (OMEPRAZOLE) take 2 tabs by mouth daily Prescriptions: OMEPRAZOLE 20 MG CPDR (OMEPRAZOLE) take 2 tabs by mouth daily  #60 x 3   Entered and Authorized by:   Vic Blackbird MD   Signed by:   Vic Blackbird MD on 08/14/2009   Method used:   Historical   RxID:   [id number]    Spoke with pharmacist, they do not cover the $RemoveB'40mg'nUdFgaFu$  omeprazole. Therefore changed to $RemoveBe'20mg'fTLzBSZnH$  2 tabs daily instead. Pt should be able to pick up prescription now

## 2010-06-26 NOTE — Letter (Signed)
Summary: [facility] Patient Note   [facility] Patient Note   Imported By: Sallee Provencal 12/11/2009 13:28:02  _____________________________________________________________________  External Attachment:    Type:   Image     Comment:   External Document

## 2010-06-26 NOTE — Miscellaneous (Signed)
  Clinical Lists Changes  Observations: Added new observation of CALCIUM: 8.9 mg/dL (07/16/2009 22:30) Added new observation of CREATININE: 1.41 mg/dL (07/16/2009 22:30) Added new observation of BUN: 12 mg/dL (07/16/2009 22:30) Added new observation of BG RANDOM: 112 mg/dL (07/16/2009 22:30) Added new observation of CO2 PLSM/SER: 23 meq/L (07/16/2009 22:30) Added new observation of CL SERUM: 102 meq/L (07/16/2009 22:30) Added new observation of K SERUM: 3.7 meq/L (07/16/2009 22:30) Added new observation of NA: 135 meq/L (07/16/2009 22:30)

## 2010-06-26 NOTE — Progress Notes (Signed)
Summary: returning call  Phone Note Call from Patient Call back at [phone]   Caller: Daughter/Latonya Reason for Call: Talk to Nurse Summary of Call: returning call Initial call taken by: Darnell Level,  September 05, 2009 4:52 PM  Follow-up for Phone Call        talked with daughter about instructions for stress echo

## 2010-06-26 NOTE — Assessment & Plan Note (Signed)
Summary: Matthew [name]   Primary Provider:  Vic Blackbird MD  CC:  rov.  History of Present Illness: 70 yo with history of CKD and HTN presented recently for evaluation of chest pain.  Patient was hospitalized in 2/11 with a chest pain episode.  He was sitting on the sofa and developed a severe ache in his central chest that lasted severe hours.  He went to the ER and the pain resolved with morphine.  Cardiac enzymes were negative and he was discharged home the next day.  He has had no chest pain since and had no chest pain prior.  The pain was not associated with exertion or with eating.  He is able to climb steps without shortness of breath. He occasionally walks for exercise but is overall fairly sedentary.  He does walk to the grocery store (about 1/4 mile away).   I had the patient do an ETT but he tired quickly, only walking for 2 minutes 30 seconds.  Therefore, we changed the test to a [employer].  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  However, cannot rule out attenuation from significant uptake below the diaphragm.    ECG: NSR, normal  Current Medications (verified): 1)  Simvastatin 20 Mg Tabs (Simvastatin) .... Take 1 Tablet By Mouth Every Night 2)  Omeprazole 20 Mg Cpdr (Omeprazole) .... Take 2 Tabs By Mouth Daily 3)  Amlodipine Besylate 10 Mg Tabs (Amlodipine Besylate) .[name] Kitchen.. 1 By Mouth Daily 4)  Metoprolol Tartrate 25 Mg Tabs (Metoprolol Tartrate) .[name] Kitchen.. 1 By Mouth Two Times A Day 5)  Nitrostat 0.4 Mg Subl (Nitroglycerin) .... As Directed 6)  Aspirin 81 Mg Tabs (Aspirin) .... Take One Tablet Once Daily 7)  Claritin 10 Mg Tabs (Loratadine) .[name] Kitchen.. 1 By Mouth Daily As Needed  Allergies (verified): No Known Drug Allergies  Past History:  Past Medical History: 1. Prostate cancer: robotic prostectomy (3/10), acute blood loss s/p transfusion 2. CKD: Thought to be due to HTN.  Creatinine has ranged 1.4-1.8.  3. HTN 4. Inguinal  hernia s/p repair 03/2008 5. hypercholsterolemia 6. esophageal ulcer 2010 7. ETT (5/11): patient only able to exercise 2'30" due to fatigue.  Lexiscan myoview (5/11) showed EF 57%, inferior perfusion defect suggestive of infarction with peri-infarction ischemia.  Also could be artifact from significant activity in the gut.    Family History: Reviewed history from 08/30/2009 and no changes required. h/o HTN, 1 brother, 5 sisters, father- age 59 `healthy`, grandfather with prostate CA (died of comorbid illness), mother dead at 14 w/ kidney DZ  No premature CAD.   Social History: Reviewed history from 07/24/2006 and no changes required. Single, from [place], [place].  Likes to walk and watch TV.  Smokes a pipe occ.  No EtoH, no drugs.  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  Review of Systems       All systems reviewed and negative except as per HPI.   Vital Signs:  Patient profile:   70 year old male Height:      69 inches Weight:      176 pounds BMI:     26.08 Pulse rate:   70 / minute Pulse rhythm:   regular BP sitting:   112 / 74  (left arm) Cuff size:   regular  Vitals Entered By: Doug Sou CMA (Oct 18, 2009 2:21 PM)  Physical Exam  General:  Well developed, well nourished, in no acute distress. Neck:  Neck supple,  no JVD. No masses, thyromegaly or abnormal cervical nodes. Lungs:  Clear bilaterally to auscultation and percussion. Heart:  Non-displaced PMI, chest non-tender; regular rate and rhythm, S1, S2 without murmurs, rubs or gallops. Carotid upstroke normal, no bruit. Pedals normal pulses. No edema, no varicosities. Abdomen:  Bowel sounds positive; abdomen soft and non-tender without masses, organomegaly, or hernias noted. No hepatosplenomegaly. Extremities:  No clubbing or cyanosis. Neurologic:  Alert and oriented x 3. Skin:  Intact without lesions or rashes. Psych:  Normal affect.   Impression & Recommendations:  Problem # 1:  CHEST  PAIN, ATYPICAL (ICD-786.59) Patient has had no further chest pain since his hospitalization several months ago.  Myoview was suggestive of inferior infarct with peri-infarction ischemia versus attenuation from gut uptake. EF was preserved.  As the patient is asymptomatic at this point and has significant CKD, will plan on a medical management strategy.  He will continue current dose of metoprolol as well as aspirin, statin, and amlodipine.    Problem # 2:  HYPERLIPIDEMIA (ZOX-096.4) LIpids/LFTs on simvastatin today. Goal LDL < 70 ideally.   Patient Instructions: 1)  Your physician recommends that you return for a FASTING lipid profile/liver profile/BMP Friday May 26,2011 after 8:30 in the morning. 2)  Your physician wants you to follow-up in: 6 months with Dr Aundra Dubin.  You will receive a reminder letter in the mail two months in advance. If you don't receive a letter, please call our office to schedule the follow-up appointment.

## 2010-06-26 NOTE — Consult Note (Signed)
Summary: [facility]   Imported By: Raymond Gurney 07/10/2009 11:50:50  _____________________________________________________________________  External Attachment:    Type:   Image     Comment:   External Document

## 2010-06-26 NOTE — Letter (Signed)
Summary: [facility]   Imported By: Marilynne Drivers 12/21/2009 09:51:00  _____________________________________________________________________  External Attachment:    Type:   Image     Comment:   External Document

## 2010-06-26 NOTE — Assessment & Plan Note (Signed)
Summary: F/U VISIT/[facility]   Vital Signs:  Patient profile:   70 year old male Height:      69 inches Weight:      176 pounds BMI:     26.08 Temp:     98.0 degrees F oral BP sitting:   120 / 70  (left arm)  Vitals Entered By: Schuyler Amor CMA (Oct 16, 2009 2:58 PM) CC: F/U HTN, sneezing Is Patient Diabetic? No Pain Assessment Patient in pain? no        Primary Care Provider:  Vic Blackbird MD  CC:  F/U HTN and sneezing.  History of Present Illness:  1. Chest pain- no chest pain since hospitilaztion , has not used any NTG, s/p nuclear treadmill testing by Dr. Aundra Dubin awaiting results. Only able to walk 2 minutes on treadmill   HTN- no side effects to meds, no lower ext swelling no HA  2. CKD- no change in meds, baseline 1.5 will follow up June 2011 with Dr. Servando Salina  3. Allergies-  sneezing runny nose past few months, no watery eyes or itchy eyes, has not tried any OTC meds 4. Urology- RTC friday with Dr Risa Grill, s/p prostate radiation  Habits & Providers  Alcohol-Tobacco-Diet     Tobacco Status: current     Tobacco Counseling: to quit use of tobacco products     Other Tobacco pipe  Current Medications (verified): 1)  Simvastatin 20 Mg Tabs (Simvastatin) .... Take 1 Tablet By Mouth Every Night 2)  Omeprazole 20 Mg Cpdr (Omeprazole) .... Take 2 Tabs By Mouth Daily 3)  Amlodipine Besylate 10 Mg Tabs (Amlodipine Besylate) .[name] Kitchen.. 1 By Mouth Daily 4)  Metoprolol Tartrate 25 Mg Tabs (Metoprolol Tartrate) .[name] Kitchen.. 1 By Mouth Two Times A Day 5)  Nitrostat 0.4 Mg Subl (Nitroglycerin) .... As Directed 6)  Aspirin 81 Mg Tabs (Aspirin) .... Take One Tablet Once Daily  Allergies (verified): No Known Drug Allergies  Physical Exam  General:  Well-developed,well-nourished,in no acute distress; alert,appropriate and cooperative throughout examination   Vital signs noted  Eyes:  No corneal or conjunctival inflammation noted. EOMI. Perrla.  wears glasses Ears:  External ear exam shows  no significant lesions or deformities.  Otoscopic examination reveals clear canals, tympanic membranes are intact bilaterally without bulging, retraction, inflammation or discharge. Hearing is grossly normal bilaterally. Mouth:  Oral mucosa and oropharynx without lesions or exudates.  poor dentition , no oral lesions Lungs:  Normal respiratory effort, chest expands symmetrically. Lungs are clear to auscultation, no crackles or wheezes. Heart:  Normal rate and regular rhythm. S1 and S2 normal, no mumur Extremities:  no edema   Impression & Recommendations:  Problem # 1:  HYPERTENSION, BENIGN SYSTEMIC (ICD-401.1) Assessment Unchanged Labs to be done by [facility] next month His updated medication list for this problem includes:    Amlodipine Besylate 10 Mg Tabs (Amlodipine besylate) .[name] Kitchen... 1 by mouth daily    Metoprolol Tartrate 25 Mg Tabs (Metoprolol tartrate) .[name] Kitchen... 1 by mouth two times a day  Orders: [place]- Est  Level 4 (09326)  Problem # 2:  CHEST PAIN, ATYPICAL (ICD-786.59) Assessment: Improved  RTC Cardiology for stress results, on statin , ASA,  BB  Orders: [place]- Est  Level 4 (71245)  Problem # 3:  ALLERGIC RHINITIS, SEASONAL (ICD-477.0) Assessment: New  willl give trial of Claritin for seasonal allergies  Orders: [place]- Est  Level 4 (80998)  Complete Medication List: 1)  Simvastatin 20 Mg Tabs (Simvastatin) .... Take 1 tablet by mouth every  night 2)  Omeprazole 20 Mg Cpdr (Omeprazole) .... Take 2 tabs by mouth daily 3)  Amlodipine Besylate 10 Mg Tabs (Amlodipine besylate) .[name] Kitchen.. 1 by mouth daily 4)  Metoprolol Tartrate 25 Mg Tabs (Metoprolol tartrate) .[name] Kitchen.. 1 by mouth two times a day 5)  Nitrostat 0.4 Mg Subl (Nitroglycerin) .... As directed 6)  Aspirin 81 Mg Tabs (Aspirin) .... Take one tablet once daily 7)  Claritin 10 Mg Tabs (Loratadine) .[name] Kitchen.. 1 by mouth daily as needed  Patient Instructions: 1)  Continue your blood pressure medication 2)  Return in 6 months  3)   Use the allergy medicine as needed Prescriptions: CLARITIN 10 MG TABS (LORATADINE) 1 by mouth daily as needed  #30 x 3   Entered and Authorized by:   Vic Blackbird MD   Signed by:   Vic Blackbird MD on 10/16/2009   Method used:   [facility]        [facility] [address] [phone]* (retail)       [address]       [place], [place]  [address]       Ph: [phone] or [phone]       Fax: [phone]   RxID:   [id number]

## 2010-06-26 NOTE — Consult Note (Signed)
Summary: [facility]   Imported By: Raymond Gurney 10/27/2009 11:53:17  _____________________________________________________________________  External Attachment:    Type:   Image     Comment:   External Document

## 2010-06-26 NOTE — Assessment & Plan Note (Signed)
Summary: f/up,tcb   Vital Signs:  Patient profile:   70 year old male Height:      64 inches Weight:      172 pounds BMI:     29.63 Temp:     97.8 degrees F oral Pulse rate:   73 / minute BP sitting:   126 / 79  (left arm) Cuff size:   regular  Vitals Entered By: Schuyler Amor CMA (July 26, 2009 3:25 PM)     CC: hospital f/u Is Patient Diabetic? No Pain Assessment Patient in pain? no        Primary Care Provider:  Vic Blackbird MD  CC:  hospital f/u.  History of Present Illness:   Pt admitted Feb 19-20 for chest pain rule out. Given NTG for as needed chest pain, has not used any. Pt did admit to 1 episdoe of burning chest pain which was better with belching and gingerale, denies SOB.  Daughter at visit today Reviewed Discharge summary, chest pain thought to be atypical, PPI increased with history of esophagitis.  Pt with cardiac risk factors therefore felt pt needed outpatient risk stratification.   Habits & Providers  Alcohol-Tobacco-Diet     Tobacco Status: current     Other Tobacco pipe  Current Medications (verified): 1)  Simvastatin 20 Mg Tabs (Simvastatin) .... Take 1 Tablet By Mouth Every Night 2)  Omeprazole 40 Mg Cpdr (Omeprazole) .[name] Kitchen.. 1 By Mouth Daily For Heartburn 3)  Amlodipine Besylate 10 Mg Tabs (Amlodipine Besylate) .[name] Kitchen.. 1 By Mouth Daily 4)  Metoprolol Tartrate 25 Mg Tabs (Metoprolol Tartrate) .[name] Kitchen.. 1 By Mouth Two Times A Day 5)  Nitrostat 0.4 Mg Subl (Nitroglycerin) .... As Directed  Allergies (verified): No Known Drug Allergies  Review of Systems       Per HPI  Physical Exam  General:  Well-developed,well-nourished,in no acute distress; alert,appropriate and cooperative throughout examination   Vital signs noted  Lungs:  Normal respiratory effort, chest expands symmetrically. Lungs are clear to auscultation, no crackles or wheezes. Heart:  Normal rate and regular rhythm. S1 and S2 normal without gallop, murmur, click, rub or other extra  sounds. Extremities:  no edema   Impression & Recommendations:  Problem # 1:  CHEST PAIN, ATYPICAL (ICD-786.59) Assessment New  pt with atypical chest pain with risk factors of Stage III kidney disease and HTN. No history of cardiac disease. Pt unable to walk on treadmill therfore will refer to cardiology for dobutamine stress testing. Lipids are well controlled, no history of DM and TSH wnl  Orders: [place]- Est Level  3 (27253) Cardiology Referral (Cardiology)  Problem # 2:  HYPERTENSION, BENIGN SYSTEMIC (ICD-401.1) Assessment: Unchanged  His updated medication list for this problem includes:    Amlodipine Besylate 10 Mg Tabs (Amlodipine besylate) .[name] Kitchen... 1 by mouth daily    Metoprolol Tartrate 25 Mg Tabs (Metoprolol tartrate) .[name] Kitchen... 1 by mouth two times a day  Orders: [place]- Est Level  3 (66440)  Complete Medication List: 1)  Simvastatin 20 Mg Tabs (Simvastatin) .... Take 1 tablet by mouth every night 2)  Omeprazole 40 Mg Cpdr (Omeprazole) .[name] Kitchen.. 1 by mouth daily for heartburn 3)  Amlodipine Besylate 10 Mg Tabs (Amlodipine besylate) .[name] Kitchen.. 1 by mouth daily 4)  Metoprolol Tartrate 25 Mg Tabs (Metoprolol tartrate) .[name] Kitchen.. 1 by mouth two times a day 5)  Nitrostat 0.4 Mg Subl (Nitroglycerin) .... As directed  Patient Instructions: 1)  I will refer to heart doctor for cardiac work-up ( Stress testing) 2)  Continue your current medications 3)  Keep the nitroglycerin in a cool place 4)  Continue the omeprazole at $RemoveBefor'40mg'rqzBTFMXEFdF$   5)  If you have chest pain again please come to the hospital  6)  Your next visit after you see the heart doctor Prescriptions: OMEPRAZOLE 40 MG CPDR (OMEPRAZOLE) 1 by mouth daily for heartburn  #30 x 3   Entered and Authorized by:   Vic Blackbird MD   Signed by:   Vic Blackbird MD on 07/26/2009   Method used:   [facility]ically [facility]        [facility] [address] [phone]* (retail)       [address]       [place],   [address]       Ph:  [phone] or [phone]       Fax: [phone]   RxID:   [id number]

## 2010-06-28 NOTE — Consult Note (Signed)
Summary: [facility]   Imported By: Samara Snide 06/21/2010 11:52:53  _____________________________________________________________________  External Attachment:    Type:   Image     Comment:   External Document

## 2010-06-28 NOTE — Assessment & Plan Note (Signed)
Summary: f13m   Visit Type:  Follow-up Primary Provider:  Vic Blackbird MD   History of Present Illness: 70 yo with history of CKD and HTN presented initially for evaluation of chest pain.  Patient was hospitalized in 2/11 with a chest pain episode.  He was sitting on the sofa and developed a severe ache in his central chest that lasted severe hours.  He went to the ER and the pain resolved with morphine.  Cardiac enzymes were negative and he was discharged home the next day.  He has had no chest pain since and had no chest pain prior.  The pain was not associated with exertion or with eating.  He is able to climb steps without shortness of breath. He occasionally walks for exercise but is overall fairly sedentary.  He does walk to the grocery store (about 1/4 mile away).  He still occasionally smokes a pipe.   I had the patient do an ETT but he tired quickly, only walking for 2 minutes 30 seconds.  Therefore, we changed the test to a [employer].  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  However, cannot rule out attenuation from significant uptake below the diaphragm.    ECG: NSR with PVC  Labs (5/11): LDL 112, HDL 63 Labs (6/11): K 4.3, creatinine 1.7  Current Medications (verified): 1)  Simvastatin 40 Mg Tabs (Simvastatin) .... One At Bedtime 2)  Omeprazole 20 Mg Cpdr (Omeprazole) .... Take 2 Tabs By Mouth Daily 3)  Amlodipine Besylate 10 Mg Tabs (Amlodipine Besylate) .[name] Kitchen.. 1 By Mouth Daily 4)  Metoprolol Tartrate 25 Mg Tabs (Metoprolol Tartrate) .[name] Kitchen.. 1 By Mouth Two Times A Day 5)  Nitrostat 0.4 Mg Subl (Nitroglycerin) .... As Directed 6)  Aspirin 81 Mg Tabs (Aspirin) .... Take One Tablet Once Daily 7)  Claritin 10 Mg Tabs (Loratadine) .[name] Kitchen.. 1 By Mouth Daily As Needed  Allergies (verified): No Known Drug Allergies  Past History:  Past Medical History: 1. Prostate cancer: robotic prostectomy (3/10), acute blood loss s/p  transfusion 2. CKD: Thought to be due to HTN.  Creatinine has ranged 1.4-1.8.  3. HTN 4. Inguinal hernia s/p repair 03/2008 5. hypercholsterolemia 6. esophageal ulcer 2010 7. Chest pain: ETT (5/11): patient only able to exercise 2'30" due to fatigue.  Lexiscan myoview (5/11) showed EF 57%, inferior perfusion defect suggestive of infarction with peri-infarction ischemia.  Also could be artifact from significant activity in the gut.    Family History: Reviewed history from 08/30/2009 and no changes required. h/o HTN, 1 brother, 5 sisters, father- age 66 `healthy`, grandfather with prostate CA (died of comorbid illness), mother dead at 37 w/ kidney DZ  No premature CAD.   Social History: Reviewed history from 07/24/2006 and no changes required. Single, from [place], [place].  Likes to walk and watch TV.  Smokes a pipe occ.  No EtoH, no drugs.  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  Vital Signs:  Patient profile:   70 year old male Height:      69 inches Weight:      179 pounds BMI:     26.53 Pulse rate:   61 / minute BP sitting:   122 / 74  (left arm) Cuff size:   regular  Vitals Entered By: Mignon Pine, RMA (May 09, 2010 3:29 PM)  Physical Exam  General:  Well developed, well nourished, in no acute distress. Neck:  Neck supple, no JVD. No masses, thyromegaly or  abnormal cervical nodes. Lungs:  Clear bilaterally to auscultation and percussion. Heart:  Non-displaced PMI, chest non-tender; regular rate and rhythm, S1, S2 without murmurs, rubs or gallops. Carotid upstroke normal, no bruit. Pedals normal pulses. No edema, no varicosities. Abdomen:  Bowel sounds positive; abdomen soft and non-tender without masses, organomegaly, or hernias noted. No hepatosplenomegaly. Extremities:  No clubbing or cyanosis. Neurologic:  Alert and oriented x 3. Psych:  Normal affect.   Impression & Recommendations:  Problem # 1:  CHEST PAIN, ATYPICAL  (ICD-786.59) Patient has had no further chest pain since his hospitalization earlier this year.  Myoview was suggestive of inferior infarct with peri-infarction ischemia versus attenuation from gut uptake. EF was preserved.  As the patient is asymptomatic at this point and has significant CKD, so will plan on a medical management strategy.  He will continue current dose of metoprolol as well as aspirin, statin, and amlodipine.  If he has further symptoms, will need to re-address management strategy.  I asked him to stop smoking completely.   Problem # 2:  HYPERLIPIDEMIA (ICD-272.4) I am going to take him off simvastatin 40 as he is also taking amlodipine 10 (higher risk of adverse events).  I will have him start atorvastatin 20 mg daily with lipids/LFTs in 2 months.  Goal LDL at least < 100, ideally < 70.   Other Orders: EKG w/ Interpretation (93000)  Patient Instructions: 1)  Your physician has recommended you make the following change in your medication:  2)  When you finish your current supply of Simvastatin(Zocor) start Lipitor $RemoveBefor'10mg'MkoykTsTeJNq$  daily--[facility]. 3)  Fasting lab 2 months after you start Lipitor.---fasting lipid/liver profile. This is scheduled for Tuesday March 6,2012 at 8:30am. 4)  Your physician wants you to follow-up in: 1 year with Dr Aundra Dubin. (DECEMBER 2012)  You will receive a reminder letter in the mail two months in advance. If you don't receive a letter, please call our office to schedule the follow-up appointment. Prescriptions: LIPITOR 20 MG TABS (ATORVASTATIN CALCIUM) one daily  #30 x 3   Entered by:   Desiree Lucy, RN, BSN   Authorized by:   Loralie Champagne, MD   Signed by:   Desiree Lucy, RN, BSN on 05/09/2010   Method used:   Electronically to        [facility] [address] [phone]* (retail)       [address]       [place], [place]  [address]       Ph: [phone] or [phone]       Fax: [phone]   RxID:    [id number]

## 2010-07-31 ENCOUNTER — Other Ambulatory Visit: Payer: Self-pay | Admitting: Cardiology

## 2010-07-31 ENCOUNTER — Encounter: Payer: Self-pay | Admitting: Cardiology

## 2010-07-31 ENCOUNTER — Other Ambulatory Visit (INDEPENDENT_AMBULATORY_CARE_PROVIDER_SITE_OTHER): Payer: No Typology Code available for payment source

## 2010-07-31 DIAGNOSIS — I2510 Atherosclerotic heart disease of native coronary artery without angina pectoris: Secondary | ICD-10-CM

## 2010-07-31 LAB — LIPID PANEL
HDL: 52.3 mg/dL (ref >39.00)
Total CHOL/HDL Ratio: 3
Triglycerides: 79 mg/dL (ref 0.0–149.0)
VLDL: 15.8 mg/dL (ref 0.0–40.0)

## 2010-07-31 LAB — HEPATIC FUNCTION PANEL
Bilirubin, Direct: 0.2 mg/dL (ref 0.0–0.3)
Total Bilirubin: 1.1 mg/dL (ref 0.3–1.2)
Total Protein: 6.7 g/dL (ref 6.0–8.3)

## 2010-08-02 ENCOUNTER — Encounter: Payer: Self-pay | Admitting: Cardiology

## 2010-08-07 NOTE — Letter (Signed)
Summary: [facility] Patient Note   [facility] Patient Note   Imported By: Sallee Provencal 08/01/2010 10:36:51  _____________________________________________________________________  External Attachment:    Type:   Image     Comment:   External Document

## 2010-08-07 NOTE — Letter (Signed)
Summary: [place], [place] [address] [place]   [place], [place] [address]   Phone: [phone]  Fax: [phone]     August 02, 2010 MRN: [id number]   Matthew [name] [address] [place], [place]  [address]   Dear [name],  Dr Aundra Dubin has reviewed your cholesterol results.  They are as follows:     Total Cholesterol:    153 (Desirable: less than 200)       HDL  Cholesterol:     52.30  (Desirable: greater than 40 for men and 50 for women)       LDL Cholesterol:       85  (Desirable: less than 100 for low risk and less than 70 for moderate to high risk)       Triglycerides:       79.0  (Desirable: less than 150)  His  recommendations include: no new recommendations. Your cholesterol is OK.   Call our office at the number listed above if you have any questions.  Lowering your LDL cholesterol is important, but it is only one of a large number of "risk factors" that may indicate that you are at risk for heart disease, stroke or other complications of hardening of the arteries.  Other risk factors include:   A.  Cigarette Smoking* B.  High Blood Pressure* C.  Obesity* D.   Low HDL Cholesterol (see yours above)* E.   Diabetes Mellitus (higher risk if your is uncontrolled) F.  Family history of premature heart disease G.  Previous history of stroke or cardiovascular disease    *These are risk factors YOU HAVE CONTROL OVER.  For more information, visit .  There is now evidence that lowering the TOTAL CHOLESTEROL AND LDL CHOLESTEROL can reduce the risk of heart disease.  The American Heart Association recommends the following guidelines for the treatment of elevated cholesterol:  1.  If there is now current heart disease and less than two risk factors, TOTAL CHOLESTEROL should be less than 200 and LDL CHOLESTEROL should be less than 100. 2.  If there is current heart disease or two or more risk factors, TOTAL CHOLESTEROL should be less  than 200 and LDL CHOLESTEROL should be less than 70.  A diet low in cholesterol, saturated fat, and calories is the cornerstone of treatment for elevated cholesterol.  Cessation of smoking and exercise are also important in the management of elevated cholesterol and preventing vascular disease.  Studies have shown that 30 to 60 minutes of physical activity most days can help lower blood pressure, lower cholesterol, and keep your weight at a healthy level.  Drug therapy is used when cholesterol levels do not respond to therapeutic lifestyle changes (smoking cessation, diet, and exercise) and remains unacceptably high.  If medication is started, it is important to have you levels checked periodically to evaluate the need for further treatment options.  Thank you,    Eliot Ford

## 2010-08-15 LAB — CBC
HCT: 44.4 % (ref 39.0–52.0)
Hemoglobin: 15.1 g/dL (ref 13.0–17.0)
Hemoglobin: 15.1 g/dL (ref 13.0–17.0)
MCHC: 34.1 g/dL (ref 30.0–36.0)
MCV: 96.4 fL (ref 78.0–100.0)
Platelets: 275 10*3/uL (ref 150–400)
RBC: 4.6 MIL/uL (ref 4.22–5.81)
RDW: 14.2 % (ref 11.5–15.5)
RDW: 14.7 % (ref 11.5–15.5)
WBC: 14.2 10*3/uL — ABNORMAL HIGH (ref 4.0–10.5)

## 2010-08-15 LAB — COMPREHENSIVE METABOLIC PANEL
ALT: 13 U/L (ref 0–53)
BUN: 12 mg/dL (ref 6–23)
CO2: 23 mEq/L (ref 19–32)
Calcium: 8.9 mg/dL (ref 8.4–10.5)
Creatinine, Ser: 1.41 mg/dL (ref 0.4–1.5)
GFR calc non Af Amer: 50 mL/min — ABNORMAL LOW (ref >60)
Glucose, Bld: 112 mg/dL — ABNORMAL HIGH (ref 70–99)

## 2010-08-15 LAB — LIPID PANEL
Cholesterol: 173 mg/dL (ref 0–200)
Total CHOL/HDL Ratio: 2.5 RATIO

## 2010-08-15 LAB — BASIC METABOLIC PANEL
BUN: 21 mg/dL (ref 6–23)
CO2: 25 mEq/L (ref 19–32)
Chloride: 102 mEq/L (ref 96–112)
Potassium: 4.3 mEq/L (ref 3.5–5.1)

## 2010-08-15 LAB — DIFFERENTIAL
Basophils Absolute: 0 10*3/uL (ref 0.0–0.1)
Lymphocytes Relative: 13 % (ref 12–46)
Lymphs Abs: 1.9 10*3/uL (ref 0.7–4.0)
Neutro Abs: 11.9 10*3/uL — ABNORMAL HIGH (ref 1.7–7.7)

## 2010-08-15 LAB — POCT CARDIAC MARKERS: Troponin i, poc: <0.05 ng/mL (ref 0.00–0.09)

## 2010-08-15 LAB — CK TOTAL AND CKMB (NOT AT ARMC)
CK, MB: 0.7 ng/mL (ref 0.3–4.0)
Total CK: 36 U/L (ref 7–232)

## 2010-08-15 LAB — HEMOGLOBIN A1C
Hgb A1c MFr Bld: 5.8 % (ref 4.6–6.1)
Mean Plasma Glucose: 120 mg/dL

## 2010-08-15 LAB — TROPONIN I: Troponin I: 0.01 ng/mL (ref 0.00–0.06)

## 2010-08-15 LAB — D-DIMER, QUANTITATIVE: D-Dimer, Quant: 0.59 ug/mL-FEU — ABNORMAL HIGH (ref 0.00–0.48)

## 2010-08-28 ENCOUNTER — Encounter: Payer: Self-pay | Admitting: Home Health Services

## 2010-09-05 LAB — BASIC METABOLIC PANEL
BUN: 19 mg/dL (ref 6–23)
CO2: 24 mEq/L (ref 19–32)
CO2: 25 mEq/L (ref 19–32)
Chloride: 103 mEq/L (ref 96–112)
Chloride: 104 mEq/L (ref 96–112)
Chloride: 106 mEq/L (ref 96–112)
GFR calc Af Amer: 37 mL/min — ABNORMAL LOW (ref >60)
GFR calc Af Amer: 42 mL/min — ABNORMAL LOW (ref >60)
GFR calc non Af Amer: 30 mL/min — ABNORMAL LOW (ref >60)
Glucose, Bld: 82 mg/dL (ref 70–99)
Glucose, Bld: 88 mg/dL (ref 70–99)
Potassium: 3.6 mEq/L (ref 3.5–5.1)
Potassium: 3.7 mEq/L (ref 3.5–5.1)
Potassium: 4.2 mEq/L (ref 3.5–5.1)
Sodium: 134 mEq/L — ABNORMAL LOW (ref 135–145)
Sodium: 135 mEq/L (ref 135–145)
Sodium: 137 mEq/L (ref 135–145)

## 2010-09-06 LAB — VANCOMYCIN, RANDOM: Vancomycin Rm: 11.2 ug/mL

## 2010-09-06 LAB — BASIC METABOLIC PANEL
BUN: 16 mg/dL (ref 6–23)
BUN: 18 mg/dL (ref 6–23)
BUN: 24 mg/dL — ABNORMAL HIGH (ref 6–23)
BUN: 25 mg/dL — ABNORMAL HIGH (ref 6–23)
BUN: 36 mg/dL — ABNORMAL HIGH (ref 6–23)
CO2: 23 mEq/L (ref 19–32)
CO2: 23 mEq/L (ref 19–32)
CO2: 24 mEq/L (ref 19–32)
Calcium: 7.3 mg/dL — ABNORMAL LOW (ref 8.4–10.5)
Calcium: 7.5 mg/dL — ABNORMAL LOW (ref 8.4–10.5)
Calcium: 8.1 mg/dL — ABNORMAL LOW (ref 8.4–10.5)
Chloride: 105 mEq/L (ref 96–112)
Chloride: 106 mEq/L (ref 96–112)
Creatinine, Ser: 1.4 mg/dL (ref 0.4–1.5)
Creatinine, Ser: 2.3 mg/dL — ABNORMAL HIGH (ref 0.4–1.5)
Creatinine, Ser: 2.98 mg/dL — ABNORMAL HIGH (ref 0.4–1.5)
Creatinine, Ser: 3.19 mg/dL — ABNORMAL HIGH (ref 0.4–1.5)
GFR calc Af Amer: 35 mL/min — ABNORMAL LOW (ref >60)
GFR calc Af Amer: 57 mL/min — ABNORMAL LOW (ref >60)
GFR calc non Af Amer: 20 mL/min — ABNORMAL LOW (ref >60)
GFR calc non Af Amer: 21 mL/min — ABNORMAL LOW (ref >60)
GFR calc non Af Amer: 47 mL/min — ABNORMAL LOW (ref >60)
GFR calc non Af Amer: 51 mL/min — ABNORMAL LOW (ref >60)
Glucose, Bld: 125 mg/dL — ABNORMAL HIGH (ref 70–99)
Glucose, Bld: 137 mg/dL — ABNORMAL HIGH (ref 70–99)
Glucose, Bld: 165 mg/dL — ABNORMAL HIGH (ref 70–99)
Glucose, Bld: 83 mg/dL (ref 70–99)
Potassium: 3.9 mEq/L (ref 3.5–5.1)
Potassium: 4.5 mEq/L (ref 3.5–5.1)
Potassium: 4.6 mEq/L (ref 3.5–5.1)
Potassium: 5.3 mEq/L — ABNORMAL HIGH (ref 3.5–5.1)
Potassium: 5.3 mEq/L — ABNORMAL HIGH (ref 3.5–5.1)
Potassium: 5.3 mEq/L — ABNORMAL HIGH (ref 3.5–5.1)
Sodium: 133 mEq/L — ABNORMAL LOW (ref 135–145)
Sodium: 138 mEq/L (ref 135–145)
Sodium: 139 mEq/L (ref 135–145)

## 2010-09-06 LAB — CBC
HCT: 27.5 % — ABNORMAL LOW (ref 39.0–52.0)
HCT: 28 % — ABNORMAL LOW (ref 39.0–52.0)
HCT: 30.5 % — ABNORMAL LOW (ref 39.0–52.0)
HCT: 35.1 % — ABNORMAL LOW (ref 39.0–52.0)
HCT: 49.3 % (ref 39.0–52.0)
Hemoglobin: 11.8 g/dL — ABNORMAL LOW (ref 13.0–17.0)
Hemoglobin: 9.2 g/dL — ABNORMAL LOW (ref 13.0–17.0)
Hemoglobin: 9.5 g/dL — ABNORMAL LOW (ref 13.0–17.0)
MCHC: 33.6 g/dL (ref 30.0–36.0)
MCHC: 33.9 g/dL (ref 30.0–36.0)
MCV: 92.4 fL (ref 78.0–100.0)
MCV: 92.5 fL (ref 78.0–100.0)
MCV: 93.1 fL (ref 78.0–100.0)
MCV: 93.3 fL (ref 78.0–100.0)
MCV: 93.4 fL (ref 78.0–100.0)
MCV: 93.8 fL (ref 78.0–100.0)
Platelets: 198 10*3/uL (ref 150–400)
Platelets: 200 10*3/uL (ref 150–400)
Platelets: 229 10*3/uL (ref 150–400)
Platelets: 231 10*3/uL (ref 150–400)
Platelets: 234 10*3/uL (ref 150–400)
Platelets: 236 10*3/uL (ref 150–400)
Platelets: 261 10*3/uL (ref 150–400)
RBC: 2.72 MIL/uL — ABNORMAL LOW (ref 4.22–5.81)
RBC: 2.9 MIL/uL — ABNORMAL LOW (ref 4.22–5.81)
RBC: 2.98 MIL/uL — ABNORMAL LOW (ref 4.22–5.81)
RDW: 14.8 % (ref 11.5–15.5)
RDW: 15.1 % (ref 11.5–15.5)
RDW: 15.4 % (ref 11.5–15.5)
WBC: 13.3 10*3/uL — ABNORMAL HIGH (ref 4.0–10.5)
WBC: 14.2 10*3/uL — ABNORMAL HIGH (ref 4.0–10.5)
WBC: 17.2 10*3/uL — ABNORMAL HIGH (ref 4.0–10.5)
WBC: 8.6 10*3/uL (ref 4.0–10.5)

## 2010-09-06 LAB — DIFFERENTIAL
Basophils Absolute: 0 10*3/uL (ref 0.0–0.1)
Basophils Absolute: 0 10*3/uL (ref 0.0–0.1)
Basophils Relative: 0 % (ref 0–1)
Eosinophils Absolute: 0 10*3/uL (ref 0.0–0.7)
Eosinophils Absolute: 0 10*3/uL (ref 0.0–0.7)
Eosinophils Absolute: 0 10*3/uL (ref 0.0–0.7)
Eosinophils Relative: 0 % (ref 0–5)
Eosinophils Relative: 6 % — ABNORMAL HIGH (ref 0–5)
Lymphocytes Relative: 18 % (ref 12–46)
Lymphocytes Relative: 3 % — ABNORMAL LOW (ref 12–46)
Lymphocytes Relative: 6 % — ABNORMAL LOW (ref 12–46)
Lymphs Abs: 0.6 10*3/uL — ABNORMAL LOW (ref 0.7–4.0)
Lymphs Abs: 0.7 10*3/uL (ref 0.7–4.0)
Lymphs Abs: 1.6 10*3/uL (ref 0.7–4.0)
Monocytes Absolute: 0.6 10*3/uL (ref 0.1–1.0)
Monocytes Absolute: 1.4 10*3/uL — ABNORMAL HIGH (ref 0.1–1.0)
Monocytes Relative: 13 % — ABNORMAL HIGH (ref 3–12)
Monocytes Relative: 16 % — ABNORMAL HIGH (ref 3–12)
Monocytes Relative: 4 % (ref 3–12)
Neutro Abs: 10.8 10*3/uL — ABNORMAL HIGH (ref 1.7–7.7)
Neutro Abs: 11.8 10*3/uL — ABNORMAL HIGH (ref 1.7–7.7)
Neutro Abs: 16 10*3/uL — ABNORMAL HIGH (ref 1.7–7.7)
Neutrophils Relative %: 81 % — ABNORMAL HIGH (ref 43–77)
Neutrophils Relative %: 83 % — ABNORMAL HIGH (ref 43–77)

## 2010-09-06 LAB — CROSSMATCH

## 2010-09-06 LAB — RENAL FUNCTION PANEL
Albumin: 1.9 g/dL — ABNORMAL LOW (ref 3.5–5.2)
BUN: 44 mg/dL — ABNORMAL HIGH (ref 6–23)
CO2: 21 mEq/L (ref 19–32)
Calcium: 7.7 mg/dL — ABNORMAL LOW (ref 8.4–10.5)
Chloride: 113 mEq/L — ABNORMAL HIGH (ref 96–112)
Creatinine, Ser: 1.71 mg/dL — ABNORMAL HIGH (ref 0.4–1.5)
GFR calc Af Amer: 49 mL/min — ABNORMAL LOW (ref >60)
GFR calc non Af Amer: 40 mL/min — ABNORMAL LOW (ref >60)
Glucose, Bld: 106 mg/dL — ABNORMAL HIGH (ref 70–99)
Phosphorus: 3.4 mg/dL (ref 2.3–4.6)
Sodium: 139 mEq/L (ref 135–145)

## 2010-09-06 LAB — OCCULT BLOOD GASTRIC / DUODENUM (SPECIMEN CUP)
Occult Blood, Gastric: POSITIVE — AB
pH, Gastric: 5

## 2010-09-06 LAB — RETICULOCYTES: Retic Count, Absolute: 30.6 10*3/uL (ref 19.0–186.0)

## 2010-09-06 LAB — PROTIME-INR
INR: 1.3 (ref 0.00–1.49)
Prothrombin Time: 16.4 seconds — ABNORMAL HIGH (ref 11.6–15.2)

## 2010-09-06 LAB — HEMOGLOBIN AND HEMATOCRIT, BLOOD
HCT: 24.8 % — ABNORMAL LOW (ref 39.0–52.0)
HCT: 27.4 % — ABNORMAL LOW (ref 39.0–52.0)
HCT: 28 % — ABNORMAL LOW (ref 39.0–52.0)
HCT: 33.8 % — ABNORMAL LOW (ref 39.0–52.0)
Hemoglobin: 9.1 g/dL — ABNORMAL LOW (ref 13.0–17.0)
Hemoglobin: 9.6 g/dL — ABNORMAL LOW (ref 13.0–17.0)

## 2010-09-06 LAB — FERRITIN: Ferritin: 122 ng/mL (ref 22–322)

## 2010-09-06 LAB — ABO/RH: ABO/RH(D): A POS

## 2010-09-06 LAB — CULTURE, BLOOD (ROUTINE X 2)

## 2010-09-06 LAB — FOLATE: Folate: 5.2 ng/mL

## 2010-09-06 LAB — IRON AND TIBC: Iron: <10 ug/dL — ABNORMAL LOW (ref 42–135)

## 2010-09-06 LAB — URINE CULTURE

## 2010-09-30 ENCOUNTER — Other Ambulatory Visit: Payer: Self-pay | Admitting: Cardiology

## 2010-10-07 ENCOUNTER — Other Ambulatory Visit: Payer: Self-pay | Admitting: Family Medicine

## 2010-10-08 NOTE — Telephone Encounter (Signed)
Refill request

## 2010-10-09 NOTE — Consult Note (Signed)
NAME:  Matthew [name], Matthew [name] NO.:  [id number]   MEDICAL RECORD NO.:  [id number]          PATIENT TYPE:  INP   LOCATION:                               FACILITY:  [facility]   PHYSICIAN:  Domingo Mend, M.D. DATE OF BIRTH:  March 16, 1941   DATE OF CONSULTATION:  04/07/2008  DATE OF DISCHARGE:                                 CONSULTATION   REFERRING PHYSICIAN:  Dickey Gave, MD of [facility].   REASON FOR CONSULTATION:  Hiccoughs and tachycardia.   HISTORY OF PRESENT ILLNESS:  Mr. Carreon is a 70 year old African  American man with recent incarcerated right hernia repair on November 4  by Dr. Donne Hazel who returned the same day to the emergency department  secondary to nausea and vomiting and inability to tolerate p.o.  He was  diagnosed with a small bowel ileus and was treated as so.  He is not  tolerating a full diet but continues to have hiccoughs about 12 out of  24 hours a day.  He has also been noted to be tachycardic and  hypotensive at times.  For this reason we are asked to consult.   ALLERGIES:  No known drug allergies.   PAST MEDICAL HISTORY:  Significant for hypertension and hyperlipidemia.   HOME MEDICATIONS:  1. Include lisinopril 40 mg daily.  2. HCTZ 25 mg daily.  3. Zocor 20 mg daily.  4. Vicodin 5/500 mg p.o. p.r.n.  5. Now he is also on Reglan 10 mg q.a.c. and q.h.s.  6. As well as Thorazine.   SOCIAL HISTORY:  He is a former smoker.  He used to use marijuana but  last used over 20 years ago.  No alcohol.   FAMILY HISTORY:  Noncontributory.   REVIEW OF SYSTEMS:  Negative except as per HPI.   PHYSICAL EXAMINATION:  VITAL SIGNS:  Today blood pressure 123/73, heart  rate 109, respirations 20, O2 sats 97% on room air with a temperature of  99.8.  GENERAL:  He is alert and oriented x3.  HEENT:  Normocephalic, atraumatic.  Pupils equally reactive to light and  accommodation with intact extraocular movements.  NECK:  He has no  bruits or goiter.  No lymphadenopathy.  No JVD.  CARDIOVASCULAR:  He is tachycardic but with a regular rhythm.  No  murmurs, rubs or gallops.  LUNGS:  Clear to auscultation bilaterally.  ABDOMEN:  Soft, nontender, nondistended with positive bowel sounds.  EXTREMITIES:  No edema.  Positive pulses.  NEUROLOGICAL:  Grossly intact, nonfocal.   LABS:  Today show a WBC of 11.9, hemoglobin of 8.4 and platelets of 400.  BMET from November 11 shows a sodium of 133, potassium 4.0, chloride 99,  bicarb 27, BUN 19, creatinine 1.85 and a glucose of 106.  A chest x-ray  shows small bilateral pleural effusions with atelectasis.  A CT scan of  the abdomen and pelvis is negative for small bowel obstruction or ileus.  Does show a postop hematoma in the right abdominal wall extending into  the right scrotum.   ASSESSMENT AND PLAN:  1. Hiccoughs.  The most common cause of hiccoughs are diseases that      irritate the phrenic nerve such as pancreatitis, severe      gastroesophageal reflux disease or gastroparesis.  I do not believe      he needs to stay in the hospital for this workup but I would      suggest discharging him on a proton pump inhibitor daily and to      have an ioutpatient gastric emptying study.  I would not discharge      him on Reglan as this could affect the results of the gastric      emptying study.  Pancreatitis would fit clinically with his      admission symptoms but the CT showed no evidence of pancreatitis      and unfortunately a lipase was not drawn upon admission.  2. Acute blood loss anemia which is secondary to surgery.  Would hold      transfusion unless hemoglobin is less than 8.  3. Acute renal failure which is likely secondary to dehydration.  I      wonder what his baseline creatinine is.  This would certainly      correlate with his sinus tachycardia and his transient hypotension.  4. Tachycardia which is sinus likely secondary to dehydration is      resolving with  IV fluid replacement.   I appreciate this consult.      Domingo Mend, M.D.  Electronically Signed     EH/MEDQ  D:  04/07/2008  T:  04/07/2008  Job:  [id number]

## 2010-10-09 NOTE — Op Note (Signed)
[name], Matthew               ACCOUNT NO.:  [id number]   MEDICAL RECORD NO.:  [id number]          PATIENT TYPE:  INP   LOCATION:  1411                         FACILITY:  [facility]   PHYSICIAN:  Lear Ng, MDDATE OF BIRTH:  10/13/1940   DATE OF PROCEDURE:  08/21/2008  DATE OF DISCHARGE:                               OPERATIVE REPORT   PROCEDURE:  Upper endoscopy.   INDICATION:  GI bleed.   MEDICATIONS:  1. Fentanyl 12.5 mcg IV.  2. Versed 1.5 mg IV.   FINDINGS:  Endoscope was inserted into the oropharynx and esophagus was  intubated.  In the midportion of esophagus down to distal portion of  esophagus was scattered superficial white exudate and ulceration with  linear erythema consistent with severe erosive esophagitis.  Near the GE  junction was a 5-mm clean-based ulcer.  Proximal esophagus was normal in  appearance.  The endoscope was advanced down to the stomach which was  normal in appearance.  Retroflexion was done which revealed a small  hiatal hernia but otherwise normal-appearing gastric mucosa.  The  endoscope was straightened, advanced into the duodenal bulb and second  portion of duodenum which were both normal.  The endoscope was withdrawn  to confirm the above findings.  The NG tube was removed prior to  insertion of an endoscope.   ASSESSMENT:  1. Severe erosive esophagitis.  2. Distal esophageal ulcer, otherwise normal upper endoscopy.  3. I suspect bleeding due to his erosive esophagitis and ulcer.   PLAN:  1. Keep NG tube out.  2. IV PPI therapy q.12 hours.  3. Clear liquid diet and start Carafate when tolerating soft diet.      Lear Ng, MD  Electronically Signed     VCS/MEDQ  D:  08/21/2008  T:  08/21/2008  Job:  [id number]

## 2010-10-09 NOTE — Consult Note (Signed)
Matthew [name], Matthew [name]               ACCOUNT NO.:  [id number]   MEDICAL RECORD NO.:  [id number]          PATIENT TYPE:  INP   LOCATION:  [place]                         FACILITY:  [facility]   PHYSICIAN:  Maudie Flakes. Hassell Done, M.D.   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 Marval Regal.  He was seen on August 02, 2008 with a BUN/CR  16/1.52.  He had that week renal ultrasound and duplex scan of renal  arteries which showed no lower tract obstruction and no renal artery  stenosis.  He was admitted August 17, 2008 for robotic laparascopic  prostatectomy by Dr. Risa Grill.  In the operating room, his systolic blood  pressure dropped to around 100 at 11:30 a.m.  There was 1500 mL blood  loss reported.  Dr. Cy Blamer nurse practitioner also reports that there  may have been a leak at the bladder/urethra anastomotic site.  Creatinine was 1.8 on August 17, 2008, 2.3 yesterday and 2.98 today.  Renal consult was requested by Dr. Risa Grill.   PRIMARY CARE PHYSICIAN:  Vic Blackbird, MD at [facility]   PAST MEDICAL HISTORY:  1. Incarcerated right inguinal hernia November 6812 complicated by      ileus, volume depletion and elevated serum creatinine which      resolved.  2. Hypertension many years.   MEDICATIONS PRIOR TO ADMISSION:  1. Lisinopril/HCTZ 20/25.  2. Amlodipine 10.  3. Zocor 20.   CURRENT MEDICATIONS:  1. Lisinopril 10.  2. Amlodipine 10.  3. Normal saline 125 an hour and p.r.n.   ALLERGIES:  NONE.   SOCIAL HISTORY:  He was born in [place], [place].  He  graduated from high school.  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  He smokes a pipe rarely.  He does not drink  alcohol.  Does not use illegal  drugs.  He is separated from his wife.   FAMILY HISTORY:  Father is alive at age 27 and has hypertension.  Mother  died at age 36 with hypertension and kidney trouble.  He has 1 brother  and 6 sisters.  No one is on dialysis.  He does not know their medical  condition.  He has 2 daughters Matthew [name] who is at the bedside  and Matthew [name] are well.)   PHYSICAL EXAMINATION:  GENERAL:  He is lethargic, but oriented x3.  VITAL SIGNS:  Temperature 99.2, pulse 130, respirations 20, blood  pressure 107/67.  HEENT:  Mouth moist.  CHEST:  Clear.  HEART:  No rub.  ABDOMEN:  Distended.  Postoperative holes are present from laparoscopic  surgery, nontender.  Bowel sounds are present.  Scar in the right lower  quadrant.  Jackson-Pratt drain left lower quadrant.  GU:  Uncircumcised.  Foley catheter in place.  EXTREMITIES:  No edema, no phlebitis.  NEURO:  Right-handed.  Strength equal.  Sensation intact.   LABORATORY DATA:  Hemoglobin 16.6 preoperative (August 11, 2008), 11.8  (August 17, 2008) and 9.1 today. White count today 17,800, PLTs 200 K.  Sodium 134, potassium 5.3, chloride 105, CO2 25, BUN 2.98 today, calcium  7.8.   IMPRESSION:  1. Acute renal failure superimposed on chronic kidney disease, ? if      secondary to anastomotic leak versus hypotension versus sepsis      versus ? other in a patient previously on blood pressure      medications.  2. Anemia.   PLAN:  Continue IV fluids bolus plus continue at 125 an hour.  Imaging  studies to rule out an anastomotic leak.  Blood and urine cultures, then  vancomycin and Zosyn to cover for infection.  Hold amlodipine (blood  pressure already low).  Discontinue lisinopril.  Type and screen, look  for other source of blood loss.  Check Hemoccult and transfuse p.r.n.  Check EKG.  We will follow.      Richard F. Hassell Done, M.D.  Electronically Signed     RFF/MEDQ  D:  08/19/2008  T:  08/19/2008  Job:  [id number]

## 2010-10-09 NOTE — Discharge Summary (Signed)
Matthew [name], [name]               ACCOUNT NO.:  [id number]   MEDICAL RECORD NO.:  [id number]          PATIENT TYPE:  INP   LOCATION:  1411                         FACILITY:  [facility]   PHYSICIAN:  Bernestine Amass, M.D.  DATE OF BIRTH:  1941-01-21   DATE OF ADMISSION:  08/17/2008  DATE OF DISCHARGE:  08/27/2008                               DISCHARGE SUMMARY   ADMISSION DIAGNOSES:  1. Adenocarcinoma of the prostate.  2. Acute renal insufficiency.  3. Acute blood loss anemia.   DISCHARGE DIAGNOSES:  1. Adenocarcinoma of the prostate.  2. Acute renal insufficiency, resolved.  3. Acute blood loss anemia, resolved.  4. Erosive esophagitis with esophageal ulcer, treated.   PROCEDURES:  1. Robotic assisted laparoscopic radical prostatectomy with bilateral      nerve spare.  2. Upper endoscopy.   HISTORY/PHYSICAL:  For full details, please see admission history and  physical.  Briefly, Matthew [name] is a 70 year old gentleman who was found  to have clinically localized adenocarcinoma of the prostate.  After  careful consideration regarding management options for treatment, he  elected to proceed with surgical therapy and a robotic assisted  laparoscopic radical prostatectomy.   HOSPITAL COURSE:  On August 17, 2008, he was taken to the operating room  and underwent the above named procedure in which he tolerated well  without complications.  Postoperatively, he was able to be transferred  to a regular hospital room following recovery from anesthesia.  He was  able to begin ambulation that evening.  Postoperatively, he was found to  have a low hemoglobin of 11.8 from baseline of 16.6.  He did sustain a  1500 mL blood loss during his surgery.  He was maintained that evening  on fluids for hydration.  On the morning of postoperative day number 1,  his hemoglobin was rechecked and also found to be low at 9.6.  Later  that afternoon after continued hydration, his hemoglobin was found to be  slightly lower at 9.1.  He did have low blood pressures and some  tachycardia.  EKG was obtained and showed normal sinus rhythm, although  sinus tachycardia.  Internal Medicine was consulted.  During that time,  he did also have a large amount of nausea and vomiting.  An NG tube was  placed and the contents were found to be occult positive.  He did have a  bowel movement and it was also checked, and found to be fecal occult  positive.  A Gastroenterology consult was obtained.  On postoperative  day number 4, an upper endoscopy was obtained for possible GI bleed.  The findings were erosive esophagitis with an esophageal ulcer.  He was  started on proton pump inhibitor therapy.  He was also given packed red  blood cells and his fluids were maintained.  Upon discharge, his  hemoglobin had began to rise and last check was 11.5.   On the morning of postoperative day number 1, he did have decreased  urine output.  Therefore, he was given IV fluids to continue hydration.  His postoperative creatinine was 2.3.  His preoperative last  baseline  creatinine was 1.67.  Due to his prior history of acute renal  insufficiency after surgical procedures, he was not given nephrotoxic  medications postoperatively.  He was continued with hydration to  increase urine output and to decrease creatinine.  His creatinine did  continue to rise to 2.98.  At that time, Dr. Marval Regal, (his  Nephrologist) was called for consultation.  He was seen by Nephrology  and continued with IV hydration.  All nephrotoxic medications were  discontinued.  Prior to discharge, his creatinine had decreased to 1.47.   On the morning of postoperative day number 6, he had maintained  excellent urine output with minimal output from the pelvic drain,  therefore, the pelvic drain was removed.  He was able to tolerate his  clear liquid diet.  Therefore, he was able to advance his diet as  tolerated.   Prior to discharge on postoperative  day number 5 due to his episode of  sinus tachycardia,  a 2-D echocardiogram was obtained to evaluate his  left ventricular function.  He was started on Imdur and metoprolol.  Postoperative day number 10, his lab work had all normalized.  He  remained hemodynamically stable.  He was able to tolerate diet without  nausea or vomiting.  Therefore, he was felt to be stable for discharge  as he had met all discharge criteria.   DISPOSITION:  Home with home health services to assist.   DISCHARGE MEDICATIONS:  He was instructed to resume his regular home  medications including:  1. Imdur.  2. Metoprolol.  3. Amlodipine.  4. Lisinopril.  5. Omeprazole.  6. In addition, he was provided a prescription for Vicodin to take as      needed for pain.  7. Told to use Colace as a stool softener.  8. He was also given a prescription for Cipro to begin 2 days prior to      his postoperative visit for Foley catheter removal.   DISCHARGE INSTRUCTIONS:  He was instructed to be ambulatory, but  specifically told to refrain from any heavy lifting, strenuous activity  or driving.  He was instructed on routine Foley catheter care.  Told to  continue to advance his diet over the course of the next few days.   FOLLOW UP:  1. He will follow up in 1 week or less for removal of Foley catheter.  2. He will also follow up with Dr. Marval Regal, his Nephrologist.  3. He will also follow up with Dr. Michail Sermon, his Gastroenterologist.      Franco Collet, NP      Bernestine Amass, M.D.  Electronically Signed    MA/MEDQ  D:  09/21/2008  T:  09/21/2008  Job:  [id number]

## 2010-10-09 NOTE — H&P (Signed)
Matthew [name], Matthew [name]               ACCOUNT NO.:  [id number]   MEDICAL RECORD NO.:  [id number]          PATIENT TYPE:  INP   LOCATION:  1537                         [facility]ACILITY:  [facility]   PHYSICIAN:  Dickey Gave, MDDATE OF BIRTH:  23-Dec-1940   DATE OF ADMISSION:  03/31/2008  DATE OF DISCHARGE:                              HISTORY & PHYSICAL   CHIEF COMPLAINT:  Nausea, vomiting, status post right inguinal repair.   HISTORY OF PRESENT ILLNESS:  A 70 year old male with a long history of  abdominal bloating, as well as a chronically incarcerated right inguinal  hernia, who is now postop day #1 status post a right inguinal hernia  repair.  He had a difficult right inguinal hernia repair, as he had his  cecum, appendix and entire ileum incarcerated into his scrotum.  This  was a very difficult procedure.  All bowel was viable.  There was no  evidence of any chronic obstruction but he did complain of a lot of  bloating after eating.  I repaired this large defect after reducing  everything with an extended Prolene hernia system.  He went home the day  of surgery as he appeared to be doing well in the recovery room, only to  call earlier today, complaining of nausea, vomiting, unable to keep  anything down.  I asked his daughter to bring him to the emergency room.  Upon my evaluation in the emergency room, he says that he has been  unable to tolerate oral intake since the surgery and has been nauseated  and thrown up since then.  He also complains of hiccups, has not passed  any gas since then, denies any fevers.  He does have a prior history of  a colonoscopy this year, which he reports was normal.   PAST MEDICAL HISTORY:  Chronic renal insufficiency, BPH, hypertension,  hypercholesterolemia.   PAST SURGICAL HISTORY:  Is only significant for the right inguinal  hernia repair.   SOCIAL HISTORY:  Positive for tobacco, occasional alcohol.   DRUG ALLERGIES:  No known drug allergies.   MEDICATIONS:  1. Lisinopril 20 mg two daily.  2. Hydrochlorothiazide 25 mg one daily.  3. Simvastatin 20 q.h.s.  4. Vicodin p.r.n. I sent him home on.   REVIEW OF SYSTEMS:  Otherwise negative.   PHYSICAL EXAM:  Shows him to be afebrile, his heart rate is in the 80s.  GENERAL:  He is a well-appearing male in no distress.  NECK:  Supple.  LUNGS:  Clear bilaterally .  HEART:  Regular rate and rhythm.  ABDOMEN:  Soft, nontender, nondistended.  His right groin dressing is  dry.  He has some moderate edema and appropriately tender to his right  groin.  EXTREMITIES:  Have no edema.   LABORATORY DATA:  He had a CBC and a BMET that are pending.  He had an  acute abdominal series that shows a likely ileus with some air-fluid  levels.   IMPRESSION:  Is a likely ileus, status post a very difficult  right  inguinal repair for a chronically incarcerated hernia.   PLAN:  Admission,  n.p.o., IV fluids, serial examinations and await his  ileus to resolve. I did discuss with him and his daughter there is a  small chance that he could have an obstruction that would require an  operation and they both understand this.  I will not place an NG tube at  this point, but if he does not improve, then we certainly may consider  this in the near future, as well, but he does not have a dilated stomach  or a very dilated small bowel on his films as of right now and he is  completely nondistended on his examination.  He and his daughter both  understand this plan.      Dickey Gave, MD  Electronically Signed     MCW/MEDQ  D:  03/31/2008  T:  04/01/2008  Job:  [id number]

## 2010-10-09 NOTE — Op Note (Signed)
[name], [name]               ACCOUNT NO.:  [id number]   MEDICAL RECORD NO.:  [id number]          PATIENT TYPE:  INP   LOCATION:  4196                         FACILITY:  [facility]   PHYSICIAN:  Bernestine Amass, M.D.  DATE OF BIRTH:  August 31, 1940   DATE OF PROCEDURE:  08/17/2008  DATE OF DISCHARGE:                               OPERATIVE REPORT   PREOPERATIVE DIAGNOSIS:  Adenocarcinoma of the prostate.   POSTOPERATIVE DIAGNOSIS:  Adenocarcinoma of the prostate.   PROCEDURE PERFORMED:  Robotic-assisted laparoscopic radical retropubic  prostatectomy.   SURGEON:  Bernestine Amass, M.D.   ASSISTANT:  Franco Collet, NP.   ANESTHESIA:  General endotracheal.   INDICATIONS:  Mr. Matthew [name] is a 70 year old male.  He was seen, evaluated  and diagnosed with adenocarcinoma of the prostate by Dr. Franchot Gallo.  The patient's PSA was just under 9.  The patient underwent  ultrasound biopsy.  He had approximately a 75-gram prostate.  His  digital rectal exam was unremarkable.  Pathology revealed relatively low-  volume Gleason 6 adenocarcinoma prostate with one core demonstrating low-  volume Gleason 7 adenocarcinoma prostate.  The patient had minimal  voiding complaints.  His overall general health was reasonably good and  he was felt to have a 10 to 15-year life expectancy.  The patient  underwent extensive counseling with Dr. Diona Fanti and the patient chose  a surgical approach.  He was sent for my consideration for robotic  prostatectomy.  I spent time discussing the situation with the patient  and his daughter.  He appeared to understand the potential treatment  options for his situation.  He appeared to understand the advantages and  disadvantages as well as complications associated with robotic  prostatectomy.  We specifically spent considerable time discussing  sexual dysfunction, urinary incontinence as well as the other risks and  complications of the surgery.  He elected to  proceed with a robotic  prostatectomy.  The patient has performed a mechanical bowel prep.  He  has received perioperative antibiotics and placement of compression  boots.  He presents now for the surgery.   DESCRIPTION OF PROCEDURE:  The patient was brought the operating room  after proper patient identification.  He was placed on the operative  table and all extremities carefully padded.  The patient then had  successful induction of general endotracheal anesthesia.  He was placed  in the mid lithotomy position and secured to the operative table.  He  was placed in steep Trendelenburg position and then prepped and draped  in the usual manner.  Foley catheter was inserted sterilely on the  operative field.   The camera port incision was chosen 18 cm above the pubic symphysis just  to the left of the umbilicus.  A standard open Hassan technique was  utilized and trocar placement as well as abdominal insufflation occurred  without incident.  All other trocars were placed with direct visual  guidance including 12-mm and 5-mm assist ports and three 8-mm robotic  ports.  Once all trocars were in position, the surgical cart was docked.  Careful inspection  revealed no obvious problems within the abdomen.  The  bladder was filled to help identify it.  The space of Retzius was opened  with electrocautery scissors and the retropubic space defined.  Superficial fat over the endopelvic fascia of the prostate and bladder  neck was then taken off.  Endopelvic fascia was then incised.  Levator  musculature was swept off the apex of the prostate and the dorsal venous  complex was isolated and stapled with an ETS stapling device with good  hemostasis.  Anterior bladder neck was identified with the aid of the  Foley balloon.  We knew that the patient did have a relatively large  middle lobe based on previous ultrasound findings.  The bladder was  entered and indigo carmine was given.  We were well away  from the  ureteral orifices, but again one could see a very large and broad-based  middle lobe extending into the base of the prostate.  The posterior  mucosa of the bladder was incised with electrocautery scissors just  proximal to the middle lobe of the prostate, but the well away from the  ureteral orifices.  The bladder neck was transected posteriorly.  The  bladder neck was fairly thin in that region.  The plane was established  between the middle lobe of the prostate and posterior bladder neck.  The  vas deferens and seminal vesicle structures were then identified and  taken individually.   Attention was then turned towards bilateral nerve sparing.  Superficial  fascia on the anterior lateral aspect of the prostate was incised.  The  neurovascular plane was established from apex to the base of the  prostate.  Once this was accomplished, the prostate was lifted  anteriorly.  The vascular pedicles of the prostate were isolated and  taken with locking clips.  The posterior plane between the prostate and  rectum was then established primarily with blunt dissecting technique  prior to take-down of the vascular pedicles.  With the aid of the Foley  catheter within the urethra, the anterior urethra was transected.  The  posterior urethra was then also transected and after taking some  superficial rectourethralis tissue, the prostate specimen was removed  from the pelvis.  Pelvic irrigation was then performed and rectal  insufflation revealed no evidence of rectal injury.  The patient did  have more blood loss than is typical with robotic prostatectomy,  primarily from some bleeding around the area of bladder neck near the  seminal vesicles and also along the middle lobe of the prostate.  The  patient, however, remained hemodynamically stable at this portion.   We felt that given the low incidence of lymph node metastasis, the lymph  node dissection was not necessary.  Because of the very  broad-based  bladder neck and wide middle lobe, the bladder neck did require closure.  In the lateral aspects of the bladder neck, figure-of-eight 3-0 Vicryl  suture was utilized to decrease the size of the bladder neck.  Indigo  carmine was again given to help identify the ureteral orifices.  The  anastomosis was then done by reapproximating the 6 o'clock posterior  bladder neck with the 6 o'clock urethral position utilizing interrupted  2-0 Vicryl suture.  Once those structures were reapproximated, the rest  anastomosis was done with a double-armed Monocryl suture in a 360-degree  running manner.  A new coude catheter was placed and the bladder was  irrigated.  One could see some leakage of urine on the  left hemipelvis.  This appeared to be way from the anastomotic site.  Careful inspection  of the lateral aspect of the bladder revealed a small rent in the  lateral portion of the bladder.  This was then closed with a running 3-0  Vicryl suture and repeat irrigation revealed no evidence of leakage.  A  pelvic drain was placed through one of the robotic trocars and placed in  the retropubic space and then secured to the skin.  The large prostatic  specimen was placed in an Endopouch retrieval bag.   The 12 mm trocar site was closed with interrupted Vicryl suture with the  aid of a suture passer.  All other trocars were removed with direct  visual guidance.  The camera port incision needed to be extended for  removal of the specimen.  The fascia was then closed with a running  Vicryl suture.  All incisions were infiltrated and closed with clips.  The bladder irrigated clear blue urine at the completion of the  procedure.  The patient remained stable and was brought to recovery room  in stable condition.      Bernestine Amass, M.D.  Electronically Signed     DSG/MEDQ  D:  08/19/2008  T:  08/20/2008  Job:  [id number]

## 2010-10-09 NOTE — Consult Note (Signed)
NAME:  Matthew [name], Matthew [name] NO.:  [id number]   MEDICAL RECORD NO.:  [id number]          PATIENT TYPE:  INP   LOCATION:  1411                         FACILITY:  [facility]   PHYSICIAN:  Domingo Mend, M.D. DATE OF BIRTH:  [birth year]   DATE OF CONSULTATION:  08/19/2008  DATE OF DISCHARGE:                                 CONSULTATION   PRIORITY CONSULTATION   Mr. Bodkin is a very pleasant, 70 year old, African American man, who  was initially admitted on March 24th by Dr. Risa Grill for a robotic  prostatectomy.  During the surgery, he had about 1500 mL of blood loss.  He does have a history of chronic renal insufficiency with a baseline  creatinine of about 1.5 on August 02, 2008.  His creatinine today is  2.98.  Today, he also experienced some tachycardia with heart rates  arising into the 140s as well as some anemia, which prompted  consultation to the medical service.  Of note, Dr. Hassell Done with Nephrology,  has already seen the patient as well and has left his recommendations in  the chart.   ALLERGIES:  He has no known drug allergies.   PAST MEDICAL HISTORY:  Significant for:  1. Chronic renal insufficiency with baseline creatinine of 1.5.  2. Hypertension.  3. Hyperlipidemia.  4. Status post a right inguinal hernia repair in November of 2009.   HOME MEDICATIONS:  Include:  1. Zocor 20 mg p.o. daily.  2. Amlodipine 10 mg daily.  3. Lisinopril/HCTZ 20/25 mg daily.   SOCIAL HISTORY:  Lives by himself, is a widower, denies any alcohol,  tobacco, or illicit drug use.  His daughter, Albin Felling, who is present at  bedside, is his primary caregiver and lives in [place] about 30  minutes away.   FAMILY HISTORY:  Noncontributory in this elderly gentleman.   REVIEW OF SYSTEMS:  Negative except as already mentioned on the HPI.   PHYSICAL EXAMINATION:  VITAL SIGNS:  Today, blood pressure 107/67, heart  rate 139, respirations 18, O2 SAT is 98% on room air with a temp of   99.1.  GENERAL:  He is alert, awake, oriented x3, does not appear to be in any  distress, although he appears to be very tired.  HEENT:  Normocephalic, atraumatic.  His pupils are equally reactive to  light and accommodation with intact extraocular movements.  NECK:  Supple, no JVD, no lymphadenopathy, no bruits, no goiter.  HEART:  Regular rate and rhythm with no murmurs, rubs, or gallops.  LUNGS:  Clear to auscultation bilaterally.  ABDOMEN:  Soft, nontender, and nondistended with positive bowel sounds.  EXTREMITIES:  He has no edema with positive pedal pulses.  NEUROLOGIC:  Exam appears to be grossly intact and nonfocal.   LABORATORY DATA:  Today include a sodium of 134, potassium 5.3, chloride  105, bicarb 25, BUN 25, creatinine of 2.98, and a glucose of 125.  WBCs  of 17.8, no differential is available, hemoglobin of 9.6, was 16.6 on  March 18th, a platelet count of 200.   IMAGING STUDIES:  Today include a chest x-ray on August 19, 2008, that  showed hypoventilation with bibasilar atelectasis with small pleural  effusions bilaterally.  A CT scan of the abdomen and pelvis without  contrast that shows mild bilateral hydronephrosis.  A small amount of  fluid in the right perinephric space and right pericolic gutter, which  could represent a small amount of hemorrhage.  A small amount of free  air in the abdomen.  Fluid-filled, slightly distended esophagus and  stomach.  Small bilateral effusions with mild bibasilar atelectasis.  Ureters are slightly distended to the level of the bladder.  There is a  Foley catheter in place.  The dilatation could be due to edema in the  bladder wall.  There is no discrete retroperitoneal hematoma obstructing  the ureters.  There is a small amount of fluid in the right inguinal  region and at the right pericolic gutter, which could represent slight  hemorrhage, but there is no defined hematoma.  He also had a VQ scan on  August 19, 2008, that was low  probability for a pulmonary embolism.   ASSESSMENT AND PLAN BY PROBLEM:  1. For his acute on chronic renal failure, it appears likely that this      is prerenal secondary to his blood loss during the surgery.  I      agree with intravenous fluid hydration as outlined in Dr. Cherlyn Cushing      consult note.  Given his absence of coronary artery disease, I      would not transfuse him unless his hemoglobin dropped below 8; it      is currently 9.6.  He does have a small amount of hydronephrosis on      his computerized tomography (CT) scan of the abdomen and if his      renal function continues to worsen despite intravenous fluids, Dr.      Risa Grill, his primary urologist, may need to perform some sort of      obstructing relieving procedure.  2. For his anemia, which is likely acute blood loss anemia, we will      check a fecal occult blood test to make sure that he is not      bleeding, although he has not had any bowel movements since his      surgery.  We will also check an anemia panel to check his vitamin      B12, folate, and iron storage.  Transfusion parameters as stated      above will be less than 8 given absence of coronary artery disease.  3. For his sinus tachycardia, which again I believe is likely related      to blood loss, his electrocardiogram (EKG) confirmed sinus      tachycardia, we will repeat an electrocardiogram in the a.m.      Nonetheless, we will also cycle his cardiac enzymes as well.  4. For his leukocytosis, today he has a white blood cell count of      17.8.  I do not have a differential.  I wonder what the source is.      Will check a urinalysis to make sure he does not have a urinary      tract infection as well as blood cultures.  A chest x-ray already      done today does not show any evidence for pneumonia.  It looks like      he has been empirically started on vancomycin and Zosyn and is also      on  Cipro.  Hence, I will discontinue the Cipro at this time.  5.  For prophylaxis, he is on mechanical prophylaxis for a deep vein      thrombosis.  We will add a proton pump inhibitor for deep vein      thrombosis prophylaxis.      Domingo Mend, M.D.  Electronically Signed     EH/MEDQ  D:  08/19/2008  T:  08/19/2008  Job:  [id number]

## 2010-10-09 NOTE — Discharge Summary (Signed)
Matthew [name], Matthew [name]               ACCOUNT NO.:  [id number]   MEDICAL RECORD NO.:  [id number]          PATIENT TYPE:  INP   LOCATION:  6387                         FACILITY[facility]   PHYSICIAN:  Dickey Gave, MDDATE OF BIRTH:  Aug 10, 1940   DATE OF ADMISSION:  03/31/2008  DATE OF DISCHARGE:  04/07/2008                               DISCHARGE SUMMARY   ADMISSION DIAGNOSES:  1. Status post right inguinal hernia repair.  2. Hiccups.  3. Nausea and vomiting.  4. Acute renal insufficiency.   DISCHARGE DIAGNOSES:  1. Status post right inguinal hernia repair.  2. Ileus.  3. Resolved acute renal insufficiency.  4. Hiccups.   HISTORY:  This is a 70 year old male with a long history of abdominal  burning and chronic incarcerated right inguinal hernia who returned on  postoperative day #1 after right inguinal hernia repair which had cecal  appendix and entire ileum incarcerated into scrotum.  This was repaired  and postoperatively he went home doing well but the next day he returned  to the emergency room unable to tolerate oral intake and had been  nauseated and throwing up.  He also complained of hiccups.  At that  point had not passed any gas.  He denied any fevers.  His admission  laboratory work also showed him to be in acute renal insufficiency.   HOSPITAL COURSE:  He was admitted.  He also had some episodes of  tachycardia that were likely related to his dehydration, which resolved  with IV replacement.  We began IV fluid replacement and his creatinine  returned to its baseline.  His hiccups continued and this was what I  think was causing most of his nausea and vomiting .  We attempted  Thorazine and Reglan with some relief, but mostly it took about 6 days  for his hiccups to resolve and for him to tolerate p.o.  I did obtain a  CT scan to ensure there was no difficulty with his surgery and his CT  scan looked like his hernia repair was intact.  He did have a moderate  amount of edema in his groin and scrotum which was to be expected, given  the extent of his hernia repair.  His hiccups eventually began to  resolve.  They were just intermittent at times.  He was able to tolerate  his diet as well and he eventually was discharged to home.   DISCHARGE MEDICATIONS:  1. Lisinopril 40 daily.  2. Hydrochlorothiazide 25 mg daily.  3. Zocor 20 mg daily.  4. Vicodin 5/500 p.r.n.  5. Reglan 10 mg q.a.c. and nightly.   FOLLOWUP:  He was due to follow up with me in one week after discharge  or sooner if he had any complications.   CONSULTATIONS:  Inpatient Hospitalist Service with Dr. Domingo Mend  who agreed with the management.  She also started him on a proton pump  inhibitor daily and also instructed Mr. Swiderski to follow up with his  primary care physician, Dr. Vic Blackbird.      Dickey Gave, MD  Electronically Signed  MCW/MEDQ  D:  04/29/2008  T:  04/29/2008  Job:  [id number]   cc:   Vic Blackbird, MD

## 2010-10-09 NOTE — Op Note (Signed)
NAME:  Matthew [name], Matthew [name]               ACCOUNT NO.:  [id number]   MEDICAL RECORD NO.:  [id number]          PATIENT TYPE:  AMB   LOCATION:  [place]                          FACILITY:  [facility]   PHYSICIAN:  Dickey Gave, MDDATE OF BIRTH:  1940-07-19   DATE OF PROCEDURE:  03/30/2008  DATE OF DISCHARGE:                               OPERATIVE REPORT   PREOPERATIVE DIAGNOSIS:  Right inguinal hernia.   POSTOPERATIVE DIAGNOSIS:  Indirect right inguinal hernia.   PROCEDURE:  Right inguinal hernia repair with extended Prolene hernia  system.   SURGEON:  Dickey Gave, MD   ASSISTANT:  None.   ANESTHESIA:  General.   FINDINGS:  Small bowel and colon incarcerated in scrotum.   SPECIMENS:  None.   DRAINS:  None.   ESTIMATED BLOOD LOSS:  100 mL.   COMPLICATIONS:  None.   DISPOSITION:  To PACU in stable condition.   HISTORY:  Mr. Matthew [name] is a 70 year old male with a 2-year history of  right groin bulge who has been having right lower quadrant and right  groin pain since the Labor Day.  He denies any obstructive symptoms.  He  does report he had a colonoscopy in 2008, which she states had some  polyps.  On his exam, he had a large right inguinal hernia that is  nontender that was incarcerated and I scheduled him for right inguinal  hernia repair with mesh.   PROCEDURE:  After informed consent was obtained, the patient was taken  to the operating room.  He was administered 1 g of intravenous Ancef.  He was then placed under general anesthesia without complication.  Sequential compression devices were placed in his lower extremities.  His right groin was then prepped and draped in the standard sterile  surgical fashion.  His hernia did not reduce with anesthesia.  After his  right groin was prepped, a surgical time-out was then performed.  Approximately 8-cm right groin incision was then made and dissection was  carried out down to the level of external abdominal oblique.  This  was  then entered with a Metzenbaum scissors through the external ring.  He  had a very very large amount of incarcerated hernia present down to his  scrotum.  This was dissected free with a combination of electrocautery  and blunt dissection until this was eventually removed from the scrotum  and up into the field.  The hernia sac was then dissected free from the  surrounding structures.  I entered the hernia sac because I was unable  to reduce the hernia.  There was his entire ileum as well as his cecum  and appendix were present in the incarcerated hernia sac.  I could not  reduce him at this point, so we placed under general endotracheal  anesthesia with muscle relaxation.  Following this, I was able to reduce  all of the intestines back into the abdominal cavity.  I excised some of  the hernia sac at this point.  The cord structures remained intact.  His  testicles had been pulled up within the wound.  This was  placed back  into its normal position.  Following this, I developed his preperitoneal  space and then closed the hernia sac with a 2-0 Vicryl suture, reduced  this completely.  This was a very difficult dissection which would  account for the 100 mL of blood loss that occurred during this  procedure.  Following the reduction of this hernia sac, an extended  Prolene hernia system was placed into the defect and the bottom portion  of the bilayer closed.  I then closed the internal oblique down to the  shelving edge over the bottom portion of the mesh.  The top bilayer was  then laid flat.  This was sutured in position at the pubic tubercle  superiorly to the internal oblique.  A T-cut was then made wrapped  around the spermatic cord.  This was sutured together as well as to the  shelving edge.  The additional sutures were also placed in the shelving  edge with 2-0 Prolene.  Ends were then laid flat under the external  abdominal oblique.  The mesh appeared in good position with no  evidence  of any further hernia.  Following this hemostasis was observed.  The  external abdominal oblique was closed with a 2-0 Vicryl, Scarpa's with a  3-0 Vicryl, and the subcuticular stitch with a 4-0 Monocryl was  performed.  Steri-Strips and sterile dressing were placed.  I  infiltrated the wound and then did an inguinal block with 10 mL of 0.25%  Marcaine.  He tolerated this well, was extubated in the operating room,  and was transferred to the recovery room in stable condition.      Dickey Gave, MD  Electronically Signed     MCW/MEDQ  D:  03/30/2008  T:  03/31/2008  Job:  [id number]   cc:   Vic Blackbird, MD

## 2010-10-09 NOTE — Consult Note (Signed)
NAMEDENNEY, SHEIN               ACCOUNT NO.:  [id number]   MEDICAL RECORD NO.:  [id number]          PATIENT TYPE:  INP   LOCATION:  1411                         FACILITY:  [facility]   PHYSICIAN:  Lear Ng, MDDATE OF BIRTH:  09-23-1940   DATE OF CONSULTATION:  08/21/2008  DATE OF DISCHARGE:                                 CONSULTATION   REQUESTING PHYSICIAN:  Michelle A. Zigmund Daniel, MD.   INDICATIONS:  GI bleed.   HISTORY OF PRESENT ILLNESS:  Mr. Longshore is a 70 year old black male with  prostate cancer who underwent a robotic-assisted laparoscopic radical  retropubic prostatectomy on August 17, 2008, and postoperatively  developed tachycardia and the [facility] medical team was consulted.  He  reportedly had 1500 mL of blood loss during his surgery and chronic  renal insufficiency.  Dr. Zigmund Daniel called me this morning to evaluate  him because of report of a large black stool as well as 300 mL of black-  colored NG tube drainage this morning.  The patient is unable to give me  any history at this time.  On August 21, 2008, nursing note reported some  nausea and an NG tube was placed which revealed 250 mL of Gastroccult-  positive fluid and decreased distention of his abdomen.  His hemoglobin  on admission was 11.8 on the day of surgery and is down to 8.0 today  with a BUN of 44 and creatinine 2.75.   PAST MEDICAL HISTORY:  1. Prostate cancer.  2. Hypertension.  3. Chronic renal insufficiency.   CURRENT MEDICATIONS:  Tylenol, Norvasc,  chlorpromazine, Benadryl  p.r.n., Colace, hydrocodone p.r.n., Protonix, Zosyn, vancomycin.  All  others as listed in the hospital record.   ALLERGIES:  No known drug allergies.   FAMILY HISTORY/SOCIAL HISTORY:  Unable to obtain.   REVIEW OF SYSTEMS:  Unable to obtain.   PHYSICAL EXAM:  VITAL SIGNS:  Temperature 98.7, pulse 120, blood  pressure 123/74.  GENERAL:  Lethargic, nonverbal response to abdominal palpation.  ABDOMEN:  Tender,  positive distention, decreased bowel sounds.   LABS:  White blood count 11.5, hemoglobin 8.0, platelet count 234, iron  at 1.3.  BUN 44, creatinine 2.75.  Fecal occult blood test positive,  Gastroccult positive.   Additional labs as listed in hospital record.   IMPRESSION:  A 70 year old black male with signs and symptoms of upper  gastrointestinal bleed concerning for peptic ulcer disease versus  hemorrhagic gastritis.  Will plan to do upper endoscopy today.  The  patient will be on intravenous proton pump inhibitor therapy every 12  hours.  After informed consent is obtained, will plan to do upper  endoscopy on Mr. Petrow.      Lear Ng, MD  Electronically Signed     VCS/MEDQ  D:  08/21/2008  T:  08/21/2008  Job:  [id number]

## 2010-11-27 ENCOUNTER — Encounter: Payer: Self-pay | Admitting: Family Medicine

## 2010-12-25 ENCOUNTER — Other Ambulatory Visit: Payer: Self-pay | Admitting: Family Medicine

## 2010-12-25 NOTE — Telephone Encounter (Signed)
Refill request

## 2011-02-05 ENCOUNTER — Other Ambulatory Visit: Payer: Self-pay | Admitting: Cardiology

## 2011-02-11 ENCOUNTER — Other Ambulatory Visit: Payer: Self-pay | Admitting: Family Medicine

## 2011-02-11 ENCOUNTER — Telehealth: Payer: Self-pay | Admitting: Family Medicine

## 2011-02-11 MED ORDER — METOPROLOL TARTRATE 25 MG PO TABS: 25.0000 mg | ORAL_TABLET | Freq: Two times a day (BID) | ORAL | Status: DC

## 2011-02-11 NOTE — Telephone Encounter (Signed)
[facility] [address] has been sending request for Met[facility]rolol for over a week now.  The patient has now been out of this medication for over a week.

## 2011-02-11 NOTE — Telephone Encounter (Signed)
Forward to PCP for refill

## 2011-02-26 LAB — DIFFERENTIAL
Basophils Absolute: 0
Basophils Absolute: 0
Basophils Relative: 0
Eosinophils Absolute: 0
Eosinophils Relative: 0
Eosinophils Relative: 3
Lymphocytes Relative: 32
Monocytes Absolute: 0.6
Monocytes Relative: 13 — ABNORMAL HIGH
Neutrophils Relative %: 79 — ABNORMAL HIGH

## 2011-02-26 LAB — CBC
HCT: 24.1 — ABNORMAL LOW
HCT: 27 — ABNORMAL LOW
Hemoglobin: 8.2 — ABNORMAL LOW
Hemoglobin: 8.4 — ABNORMAL LOW
Hemoglobin: 8.4 — ABNORMAL LOW
Hemoglobin: 9.1 — ABNORMAL LOW
MCHC: 33.3
MCHC: 33.5
MCHC: 33.5
MCHC: 33.9
MCV: 94.6
MCV: 96.5
Platelets: 267
Platelets: 270
Platelets: 281
RBC: 2.5 — ABNORMAL LOW
RBC: 2.58 — ABNORMAL LOW
RBC: 2.58 — ABNORMAL LOW
RDW: 13.2
RDW: 13.9
RDW: 14.1
WBC: 11.9 — ABNORMAL HIGH
WBC: 18 — ABNORMAL HIGH
WBC: 4.5

## 2011-02-26 LAB — BASIC METABOLIC PANEL
BUN: 12
BUN: 19
BUN: 48 — ABNORMAL HIGH
BUN: 9
CO2: 26
CO2: 26
CO2: 27
Calcium: 7.9 — ABNORMAL LOW
Calcium: 8.3 — ABNORMAL LOW
Calcium: 8.5
Chloride: 101
Chloride: 99
Creatinine, Ser: 1.49
Creatinine, Ser: 1.85 — ABNORMAL HIGH
Creatinine, Ser: 3.35 — ABNORMAL HIGH
GFR calc Af Amer: 33 — ABNORMAL LOW
GFR calc Af Amer: 44 — ABNORMAL LOW
GFR calc Af Amer: 51 — ABNORMAL LOW
GFR calc non Af Amer: 37 — ABNORMAL LOW
GFR calc non Af Amer: 42 — ABNORMAL LOW
GFR calc non Af Amer: 47 — ABNORMAL LOW
GFR calc non Af Amer: 59 — ABNORMAL LOW
Glucose, Bld: 105 — ABNORMAL HIGH
Glucose, Bld: 115 — ABNORMAL HIGH
Glucose, Bld: 121 — ABNORMAL HIGH
Glucose, Bld: 138 — ABNORMAL HIGH
Potassium: 3.6
Potassium: 3.8
Sodium: 133 — ABNORMAL LOW
Sodium: 134 — ABNORMAL LOW
Sodium: 134 — ABNORMAL LOW

## 2011-02-26 LAB — HEPATIC FUNCTION PANEL
Albumin: 3.4 — ABNORMAL LOW
Indirect Bilirubin: 1 — ABNORMAL HIGH
Total Protein: 6.2

## 2011-02-26 LAB — URINALYSIS, ROUTINE W REFLEX MICROSCOPIC
Nitrite: NEGATIVE
Specific Gravity, Urine: 1.016
pH: 6

## 2011-02-26 LAB — COMPREHENSIVE METABOLIC PANEL
AST: 14
Albumin: 3.8
Alkaline Phosphatase: 110
Chloride: 102
Creatinine, Ser: 1.59 — ABNORMAL HIGH
GFR calc Af Amer: 53 — ABNORMAL LOW
Potassium: 4.1
Total Bilirubin: 1.2

## 2011-02-26 LAB — URINE CULTURE

## 2011-03-04 ENCOUNTER — Encounter: Payer: Self-pay | Admitting: Cardiology

## 2011-03-08 ENCOUNTER — Other Ambulatory Visit: Payer: Self-pay | Admitting: *Deleted

## 2011-03-08 ENCOUNTER — Telehealth: Payer: Self-pay | Admitting: Family Medicine

## 2011-03-08 MED ORDER — LORATADINE 10 MG PO TABS: 10.0000 mg | ORAL_TABLET | Freq: Every day | ORAL | Status: DC

## 2011-03-08 NOTE — Telephone Encounter (Signed)
Mr. Kistler daughter is calling to check on the status of the refill request [facility] [address] [facility] in for his Allergy medication.  Since Dr. Kennon Portela box was empty, I let her know that he most likely has it.  She would like a call when it is ready.

## 2011-03-08 NOTE — Telephone Encounter (Signed)
Called pt's dtr and informed that meds refilled and at the pharmacy. Javier Glazier, Gerrit Heck

## 2011-03-11 ENCOUNTER — Other Ambulatory Visit: Payer: Self-pay | Admitting: Family Medicine

## 2011-03-11 MED ORDER — LORATADINE 10 MG PO TABS: 10.0000 mg | ORAL_TABLET | Freq: Every day | ORAL | Status: DC

## 2011-03-18 ENCOUNTER — Ambulatory Visit (INDEPENDENT_AMBULATORY_CARE_PROVIDER_SITE_OTHER): Payer: No Typology Code available for payment source | Admitting: Family Medicine

## 2011-03-18 ENCOUNTER — Encounter: Payer: Self-pay | Admitting: Family Medicine

## 2011-03-18 VITALS — BP 127/83 | HR 57 | Temp 97.7°F | Ht 69.0 in | Wt 179.0 lb

## 2011-03-18 DIAGNOSIS — I10 Essential (primary) hypertension: Secondary | ICD-10-CM

## 2011-03-18 DIAGNOSIS — Z23 Encounter for immunization: Secondary | ICD-10-CM

## 2011-03-18 DIAGNOSIS — E785 Hyperlipidemia, unspecified: Secondary | ICD-10-CM

## 2011-03-18 DIAGNOSIS — I25810 Atherosclerosis of coronary artery bypass graft(s) without angina pectoris: Secondary | ICD-10-CM

## 2011-03-18 DIAGNOSIS — K2211 Ulcer of esophagus with bleeding: Secondary | ICD-10-CM

## 2011-03-18 LAB — BASIC METABOLIC PANEL
BUN: 19 mg/dL (ref 6–23)
Chloride: 102 mEq/L (ref 96–112)
Glucose, Bld: 90 mg/dL (ref 70–99)
Potassium: 4.2 mEq/L (ref 3.5–5.3)

## 2011-03-18 MED ORDER — OMEPRAZOLE 20 MG PO CPDR: 20.0000 mg | DELAYED_RELEASE_CAPSULE | Freq: Every day | ORAL | Status: DC

## 2011-03-18 NOTE — Patient Instructions (Signed)
It was good to meet you today Your overall doing very well We will draw some baseline labwork Come back to see me in 9months God Bless, Shanda Howells MD

## 2011-03-18 NOTE — Assessment & Plan Note (Addendum)
>>  ASSESSMENT AND PLAN FOR HYPERLIPIDEMIA WRITTEN ON 03/18/2011 10:16 AM BY Jarnell Cordaro J, MD  LDL at less than 100 on Lipitor. Will continue to follow.   >>ASSESSMENT AND PLAN FOR HISTORY OF ESOPHAGEAL ULCER WITH BLEEDING WRITTEN ON 03/18/2011 10:15 AM BY Kolby Myung J, MD  Patient previously on Prilosec. Will restart this today in setting of history of esophageal ulcer.

## 2011-03-18 NOTE — Assessment & Plan Note (Signed)
Be followed by Dr. Marigene Ehlers. Currently clinically asymptomatic.

## 2011-03-18 NOTE — Progress Notes (Signed)
  Subjective:    Patient ID: Matthew [name], male    DOB: Jun 15, 1940, 70 y.o.   MRN: [id number]  HPI Patient is here for general medical followup. Patient is also here for new M.D. visit. He is a previous patient of Dr. Buelah Manis.   Patient has a baseline history of coronary artery disease status post CABG in 2009. Patient is seen by Dr. Marigene Ehlers for cardiology management. Patient is currently medically optimized including beta blocker, statin therapy. Today, patient denies any acute symptoms. Patient denies any chest pain, shortness of breath, headache, orthopnea, PND,lower extremity swelling. Patient states he walks every day for his health and also in part "to be nosy" with his neighbors.  Patient is not checking his blood pressures daily. Patient does not desire a blood pressure cuff at home.   Patient also has a background history of chronic kidney disease. He is not on an ACE because of this. He is seen by Dr. Marval Regal with [facility]. He was last seen last week. There were  no major medical changes performed at that time.  Patient is also refusing colonoscopy. Review of Systems See HPI, otherwise 12 point ROS negative    Objective:   Physical Exam Gen: up in chair, NAD HEENT: NCAT, EOMI, TMs clear bilaterally CV: RRR, no murmurs auscultated PULM: CTAB, no wheezes, rales, rhoncii ABD: S/NT/+ bowel sounds  EXT: 2+ peripheral pulses, trace LE edema   Assessment & Plan:

## 2011-03-18 NOTE — Assessment & Plan Note (Signed)
Stable on current regimen. We will check a BMET today. Discussed importance of daily blood pressure monitoring, which patient is considering.

## 2011-03-18 NOTE — Assessment & Plan Note (Signed)
Patient previously on Prilosec. Will restart this today in setting of history of esophageal ulcer.

## 2011-07-22 ENCOUNTER — Telehealth: Payer: Self-pay | Admitting: Family Medicine

## 2011-07-22 MED ORDER — AMLODIPINE BESYLATE 10 MG PO TABS: 10.0000 mg | ORAL_TABLET | Freq: Every day | ORAL | Status: DC

## 2011-07-22 NOTE — Telephone Encounter (Signed)
Daughter is calling because her fathers pharmacy has been trying since the beginning of February to get a refill on Amlodipine.  [facility] [address]

## 2011-07-22 NOTE — Telephone Encounter (Signed)
Rx sent . Message left on voicemail that Rx has been sent and patient will need appointment in April for follow up.

## 2011-09-06 ENCOUNTER — Other Ambulatory Visit: Payer: Self-pay | Admitting: Cardiology

## 2011-09-06 MED ORDER — ATORVASTATIN CALCIUM 20 MG PO TABS: 20.0000 mg | ORAL_TABLET | Freq: Every day | ORAL | Status: DC

## 2011-10-16 ENCOUNTER — Other Ambulatory Visit: Payer: Self-pay | Admitting: Family Medicine

## 2012-01-08 ENCOUNTER — Other Ambulatory Visit: Payer: Self-pay | Admitting: *Deleted

## 2012-01-08 MED ORDER — AMLODIPINE BESYLATE 10 MG PO TABS: 10.0000 mg | ORAL_TABLET | Freq: Every day | ORAL | Status: DC

## 2012-03-05 ENCOUNTER — Other Ambulatory Visit: Payer: Self-pay | Admitting: *Deleted

## 2012-03-05 MED ORDER — OMEPRAZOLE 20 MG PO CPDR: 20.0000 mg | DELAYED_RELEASE_CAPSULE | Freq: Every day | ORAL | Status: DC

## 2012-04-27 ENCOUNTER — Other Ambulatory Visit: Payer: Self-pay | Admitting: *Deleted

## 2012-04-27 MED ORDER — METOPROLOL TARTRATE 25 MG PO TABS: 25.0000 mg | ORAL_TABLET | Freq: Two times a day (BID) | ORAL | Status: DC

## 2012-04-27 MED ORDER — AMLODIPINE BESYLATE 10 MG PO TABS: 10.0000 mg | ORAL_TABLET | Freq: Every day | ORAL | Status: DC

## 2012-07-02 ENCOUNTER — Ambulatory Visit (INDEPENDENT_AMBULATORY_CARE_PROVIDER_SITE_OTHER): Payer: No Typology Code available for payment source | Admitting: Family Medicine

## 2012-07-02 ENCOUNTER — Encounter: Payer: Self-pay | Admitting: Family Medicine

## 2012-07-02 VITALS — BP 135/82 | HR 71 | Temp 98.8°F | Wt 175.1 lb

## 2012-07-02 DIAGNOSIS — I10 Essential (primary) hypertension: Secondary | ICD-10-CM

## 2012-07-02 DIAGNOSIS — K2211 Ulcer of esophagus with bleeding: Secondary | ICD-10-CM

## 2012-07-02 DIAGNOSIS — E785 Hyperlipidemia, unspecified: Secondary | ICD-10-CM

## 2012-07-02 MED ORDER — ATORVASTATIN CALCIUM 20 MG PO TABS: 20.0000 mg | ORAL_TABLET | Freq: Every day | ORAL | Status: DC

## 2012-07-02 MED ORDER — OMEPRAZOLE 20 MG PO CPDR: 20.0000 mg | DELAYED_RELEASE_CAPSULE | Freq: Every day | ORAL | Status: DC

## 2012-07-02 MED ORDER — AMLODIPINE BESYLATE 10 MG PO TABS: 10.0000 mg | ORAL_TABLET | Freq: Every day | ORAL | Status: DC

## 2012-07-02 MED ORDER — METOPROLOL TARTRATE 25 MG PO TABS: 25.0000 mg | ORAL_TABLET | Freq: Two times a day (BID) | ORAL | Status: DC

## 2012-07-02 NOTE — Patient Instructions (Signed)
It was great to meet you today! I am giving you refills on 4 of your 5 medications.  Please talk with Dr. Loletha Grayer about your calcitriol. Please have Dr. Loletha Grayer fax copies of your lab work over to me so we can see if there is anything else we need to check on.

## 2012-07-07 ENCOUNTER — Encounter: Payer: Self-pay | Admitting: Family Medicine

## 2012-07-07 NOTE — Assessment & Plan Note (Signed)
No evidence of recurrence. We'll continue current medications.

## 2012-07-07 NOTE — Progress Notes (Signed)
Subjective: The patient is a 72 y.o. year old male who presents today for followup.  1. Hypertension: No chest pain, new shortness of breath, or lower chin the swelling. Takes Norvasc 10 and metoprolol 25 twice a day. Is not noticed any significant side effects.  2. Hyperlipidemia: Denies any myalgias or muscle weakness. Continues to take Lipitor.  3. History of RVR: Denies any palpitations, chest pain, dizziness, visual changes, or episodes of loss of balance. Has been taking metoprolol for the history of this episode for several years now.  4. History of erosive esophagitis: Denies any symptoms of heartburn. Takes omeprazole and a daily basis.  Patient's past medical, social, and family history were reviewed and updated as appropriate. History  Substance Use Topics  . Smoking status: Current Every Day Smoker    Types: Pipe  . Smokeless tobacco: Not on file  . Alcohol Use: Not on file   Objective:  Filed Vitals:   07/02/12 1620  BP: 135/82  Pulse: 71  Temp: 98.8 F (37.1 C)   Gen: No acute distress, appropriate weight CV: Regular rate and rhythm, no murmurs appreciated Resp: Clear to auscultation bilaterally Ext: No edema  Assessment/Plan:  Please also see individual problems in problem list for problem-specific plans.

## 2012-07-07 NOTE — Assessment & Plan Note (Addendum)
>>  ASSESSMENT AND PLAN FOR HYPERLIPIDEMIA WRITTEN ON 07/07/2012  9:02 AM BY Aldine Grainger D, MD  Recheck fasting lipid panel today. Continue current medications   >>ASSESSMENT AND PLAN FOR HISTORY OF ESOPHAGEAL ULCER WITH BLEEDING WRITTEN ON 07/07/2012  9:03 AM BY Lamyra Malcolm D, MD  No evidence of recurrence. We'll continue current medications.

## 2012-07-07 NOTE — Assessment & Plan Note (Signed)
Well controlled today. Continue current medications

## 2012-08-03 ENCOUNTER — Other Ambulatory Visit: Payer: Self-pay | Admitting: *Deleted

## 2012-08-03 MED ORDER — AMLODIPINE BESYLATE 10 MG PO TABS: 10.0000 mg | ORAL_TABLET | Freq: Every day | ORAL | Status: DC

## 2013-01-15 ENCOUNTER — Encounter: Payer: Self-pay | Admitting: Family Medicine

## 2013-01-15 ENCOUNTER — Ambulatory Visit (HOSPITAL_COMMUNITY)
Admission: RE | Admit: 2013-01-15 | Discharge: 2013-01-15 | Disposition: A | Discharge status: Home / Self Care | Payer: No Typology Code available for payment source | Source: Ambulatory Visit | Attending: Family Medicine | Admitting: Family Medicine

## 2013-01-15 ENCOUNTER — Ambulatory Visit (INDEPENDENT_AMBULATORY_CARE_PROVIDER_SITE_OTHER): Payer: No Typology Code available for payment source | Admitting: Family Medicine

## 2013-01-15 VITALS — BP 126/83 | HR 65 | Temp 98.1°F | Wt 168.0 lb

## 2013-01-15 DIAGNOSIS — I200 Unstable angina: Secondary | ICD-10-CM

## 2013-01-15 DIAGNOSIS — M25512 Pain in left shoulder: Secondary | ICD-10-CM

## 2013-01-15 DIAGNOSIS — H9190 Unspecified hearing loss, unspecified ear: Secondary | ICD-10-CM

## 2013-01-15 DIAGNOSIS — M25519 Pain in unspecified shoulder: Secondary | ICD-10-CM | POA: Insufficient documentation

## 2013-01-15 DIAGNOSIS — M79609 Pain in unspecified limb: Secondary | ICD-10-CM | POA: Insufficient documentation

## 2013-01-15 HISTORY — DX: Unstable angina: I20.0

## 2013-01-15 HISTORY — DX: Unspecified hearing loss, unspecified ear: H91.90

## 2013-01-15 MED ORDER — NITROGLYCERIN 0.4 MG SL SUBL: 0.4000 mg | SUBLINGUAL_TABLET | SUBLINGUAL | Status: DC

## 2013-01-15 NOTE — Assessment & Plan Note (Signed)
No cerumen impaction. Pt declined hearing screen today. Continue to monitor.

## 2013-01-15 NOTE — Assessment & Plan Note (Signed)
Given risk factors, smoking history, h/o CAD, age and HPI, patient has a high HEART score. He is adamantly declining hospital admission or work up in the ED. Patient seen and examined with Dr. McDiarmid, who also gave information to patient. Will d/c home with strict instructions to return to the ED for evaluation. Also given nitro sublingual prn for pain. If he has to take ONE tab, he should go to the hospital. Also given appointment to follow up with [facility] cardiology.

## 2013-01-15 NOTE — Patient Instructions (Addendum)
I am sorry you are not feeling well. I am not sure the cause of our pain.  Since you do not want to go to the hospital to be checked, we will send you home with nitro tablet for pain.  If you have to take ONE nitro tablet, you must go to the emergency room to be seen for your heart.  Please follow up with [facility] cardiology and with your primary doctor next week.  Amber M. Hairford, M.D.

## 2013-01-15 NOTE — Progress Notes (Signed)
Patient ID: BRONX BROGDEN, male   DOB: 01/18/1941, 72 y.o.   MRN: [id number]  [facility] Rennie Hack M. Andry Bogden, MD Phone: [phone]   Subjective: HPI: Patient is a 72 y.o. male presenting to clinic today for same day appointment. Concerns today include side pain and decreased hearing  1. Side/chest pain- Patient reports pain in his left shoulder, under his arm and around to his back x2 days. He has a history of CAD with bypass, HTN and HLD. He is also an everyday smoker. Granddaughter states that when he called her he was very concerned that the pain was severe and he does not usually complain. He is not as active as usual. Getting better since it started 2 days ago. Tried aleve which has helped. Increased burping. Pain is worse if he is sitting up a long time. Not worse with walking (actually better with movement.)   2. Decreased hearing- Family reports he has not been hearing as well for the last few months. He is listening to the TV louder than usual and does not answer the door because he cannot hear other's knocking. Pt states he chooses to ignore others.   History Reviewed: Everyday smoker.  ROS: Please see HPI above.  Objective: Office vital signs reviewed. BP 126/83  Pulse 65  Temp(Src) 98.1 F (36.7 C) (Oral)  Wt 168 lb (76.204 kg)  BMI 24.8 kg/m2  Physical Examination:  General: Awake, alert. NAD. Lying on table but no distress. Happy and smiling/laughing HEENT: Atraumatic, normocephalic. MMM. TM visualized b/l with no cerumen Pulm: CTAB, no wheezes. Good effort Cardio: RRR, no murmurs appreciated. No chest wall tenderness. Mild tenderness to deep palpation near axilla.  Abdomen:+BS, soft, nontender, nondistended Extremities: No edema. FROM of left shoulder with good strength. No calf tenderness Neuro: Grossly intact  Assessment: 72 y.o. male with chest pain and decreased hearing.   Plan: See Problem List and After Visit Summary

## 2013-01-18 ENCOUNTER — Encounter: Payer: Self-pay | Admitting: *Deleted

## 2013-01-19 ENCOUNTER — Ambulatory Visit (INDEPENDENT_AMBULATORY_CARE_PROVIDER_SITE_OTHER): Payer: No Typology Code available for payment source | Admitting: Cardiology

## 2013-01-19 ENCOUNTER — Encounter: Payer: Self-pay | Admitting: Cardiology

## 2013-01-19 VITALS — BP 130/80 | HR 65 | Ht 69.0 in | Wt 171.0 lb

## 2013-01-19 DIAGNOSIS — R079 Chest pain, unspecified: Secondary | ICD-10-CM

## 2013-01-19 DIAGNOSIS — I10 Essential (primary) hypertension: Secondary | ICD-10-CM

## 2013-01-19 DIAGNOSIS — E785 Hyperlipidemia, unspecified: Secondary | ICD-10-CM

## 2013-01-19 NOTE — Patient Instructions (Addendum)
Your physician has requested that you have a lexiscan myoview. For further information please visit [email or web address]. Please follow instruction sheet, as given.  Your physician recommends that you return for a FASTING lipid profile /BMET.   Your physician wants you to follow-up in: 1 year with Dr Aundra Dubin. (August 2015).  You will receive a reminder letter in the mail two months in advance. If you don't receive a letter, please call our office to schedule the follow-up appointment.

## 2013-01-20 DIAGNOSIS — R079 Chest pain, unspecified: Secondary | ICD-10-CM | POA: Insufficient documentation

## 2013-01-20 NOTE — Progress Notes (Signed)
Patient ID: Matthew [name], male   DOB: [birth year], 72 y.o.   MRN: [id number] PCP: Dr. Jess Barters  72 yo with history of CKD and HTN presents for followup. Patient was hospitalized in 2/11 with a chest pain episode. He was sitting on the sofa and developed a severe ache in his central chest that lasted several hours. He went to the ER and the pain resolved with morphine. Cardiac enzymes were negative and he was discharged home the next day. I had the patient do an ETT but he tired quickly, only walking for 2 minutes 30 seconds. Therefore, we changed the test to a [employer]. 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 However, cannot rule out attenuation from significant uptake below the diaphragm. Given CKD and lack of ongoing symptoms, he was treated medically.  I have not seen him since 12/11.   Last Wednesday, patient developed pain in the left upper flank and back.  This lasted on and off until Saturday.  The pain would last for hours at a time.  It was worse with deep breathing and better with activity or belching.  He has had no pain at all since Saturday.  Chest wall was not tender.  He was not short of breath.  At baseline, patient does not have exertional chest pain or dyspnea.  He is not very active.    ECG: NSR, normal  Labs (5/11): LDL 112, HDL 63  Labs (6/11): K 4.3, creatinine 1.7  Labs (2/14): K 4.4, creatinine 1.47   Allergies (verified):  No Known Drug Allergies   Past Medical History:  1. Prostate cancer: robotic prostectomy (3/10), acute blood loss s/p transfusion  2. CKD: Thought to be due to HTN. Creatinine has ranged 1.4-1.8.  3. HTN  4. Inguinal hernia s/p repair 03/2008  5. hypercholsterolemia  6. esophageal ulcer 2010  7. Chest pain: ETT (5/11): patient only able to exercise 2'30" due to fatigue. Lexiscan myoview (5/11) showed EF 57%, inferior perfusion defect suggestive of infarction with peri-infarction ischemia. Also  could be artifact from significant activity in the gut.   Family History:  h/o HTN, 1 brother, 5 sisters, father- age 57 `healthy`, grandfather with prostate CA (died of comorbid illness), mother dead at 56 w/ kidney DZ  No premature CAD.   Social History:  Single, from [place], [place]. Likes to walk and watch TV. Smokes a pipe occ. No EtoH, no drugs. 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  ROS: All systems reviewed and negative except as per HPI.   Current Outpatient Prescriptions  Medication Sig Dispense Refill  . amLODipine (NORVASC) 10 MG tablet Take 1 tablet (10 mg total) by mouth daily.  90 tablet  3  . aspirin 81 MG tablet Take 81 mg by mouth daily.        [name] Kitchen atorvastatin (LIPITOR) 20 MG tablet Take 1 tablet (20 mg total) by mouth daily.  90 tablet  3  . calcitRIOL (ROCALTROL) 0.25 MCG capsule       . loratadine (CLARITIN) 10 MG tablet Take 1 tablet (10 mg total) by mouth daily.  30 tablet  11  . metoprolol tartrate (LOPRESSOR) 25 MG tablet Take 1 tablet (25 mg total) by mouth 2 (two) times daily.  180 tablet  3  . nitroGLYCERIN (NITROSTAT) 0.4 MG SL tablet Place 1 tablet (0.4 mg total) under the tongue as directed.  20 tablet  0  . omeprazole (PRILOSEC) 20 MG capsule Take 1  capsule (20 mg total) by mouth daily.  90 capsule  3   No current facility-administered medications for this visit.    BP 130/80  Pulse 65  Ht $R'5\' 9"'nq$  (1.753 m)  Wt 77.565 kg (171 lb)  BMI 25.24 kg/m2  SpO2 99% General: NAD Neck: No JVD, no thyromegaly or thyroid nodule.  Lungs: Clear to auscultation bilaterally with normal respiratory effort. CV: Nondisplaced PMI.  Heart regular S1/S2, no S3/S4, no murmur.  No peripheral edema.  No carotid bruit.  Normal pedal pulses.  Abdomen: Soft, nontender, no hepatosplenomegaly, no distention.  Neurologic: Alert and oriented x 3.  Psych: Normal affect. Extremities: No clubbing or cyanosis.  MSK: Chest wall not tender.   Assessment/Plan: 1.  Chest pain: Atypical chest pain.  Prior stress test in 2011 concerning for prior inferior MI versus attenuation.  Given CKD and no further symptoms, he did not have cardiac catheterization but was treated with ASA, statin, and beta blocker.  Of note, he has not had CABG (or any other coronary revasculariziation) and I am not sure how this got into the medical record.   - Continue ASA and statin. - I will arrange for Lexiscan-Cardiolite (has a hard time walking on a treadmill).  2. HTN: BP is controlled on current regimen.  3. Hyperlipidemia: Check lipids.  He will continue atorvastatin.  4. CKD: Creatinine has been stable over time.  BMET today.  Loralie Champagne 01/20/2013

## 2013-02-01 ENCOUNTER — Other Ambulatory Visit (INDEPENDENT_AMBULATORY_CARE_PROVIDER_SITE_OTHER): Payer: No Typology Code available for payment source

## 2013-02-01 ENCOUNTER — Ambulatory Visit (HOSPITAL_COMMUNITY): Payer: No Typology Code available for payment source | Attending: Cardiology | Admitting: Radiology

## 2013-02-01 VITALS — BP 127/81 | Ht 69.0 in | Wt 169.0 lb

## 2013-02-01 DIAGNOSIS — R079 Chest pain, unspecified: Secondary | ICD-10-CM | POA: Insufficient documentation

## 2013-02-01 DIAGNOSIS — R5381 Other malaise: Secondary | ICD-10-CM | POA: Insufficient documentation

## 2013-02-01 DIAGNOSIS — R0602 Shortness of breath: Secondary | ICD-10-CM

## 2013-02-01 DIAGNOSIS — R0989 Other specified symptoms and signs involving the circulatory and respiratory systems: Secondary | ICD-10-CM | POA: Insufficient documentation

## 2013-02-01 DIAGNOSIS — R0609 Other forms of dyspnea: Secondary | ICD-10-CM | POA: Insufficient documentation

## 2013-02-01 DIAGNOSIS — I25810 Atherosclerosis of coronary artery bypass graft(s) without angina pectoris: Secondary | ICD-10-CM

## 2013-02-01 DIAGNOSIS — F17200 Nicotine dependence, unspecified, uncomplicated: Secondary | ICD-10-CM | POA: Insufficient documentation

## 2013-02-01 DIAGNOSIS — E785 Hyperlipidemia, unspecified: Secondary | ICD-10-CM | POA: Insufficient documentation

## 2013-02-01 DIAGNOSIS — I10 Essential (primary) hypertension: Secondary | ICD-10-CM | POA: Insufficient documentation

## 2013-02-01 LAB — LIPID PANEL
Total CHOL/HDL Ratio: 3
VLDL: 16 mg/dL (ref 0.0–40.0)

## 2013-02-01 LAB — BASIC METABOLIC PANEL
BUN: 15 mg/dL (ref 6–23)
CO2: 23 mEq/L (ref 19–32)
Calcium: 9.1 mg/dL (ref 8.4–10.5)
GFR: 64.21 mL/min (ref >60.00)
Glucose, Bld: 80 mg/dL (ref 70–99)

## 2013-02-01 MED ORDER — REGADENOSON 0.4 MG/5ML IV SOLN
0.4000 mg | Freq: Once | INTRAVENOUS | Status: AC
  Administered 2013-02-01: 0.4 mg via INTRAVENOUS

## 2013-02-01 MED ORDER — TECHNETIUM TC 99M SESTAMIBI GENERIC - CARDIOLITE
30.0000 | Freq: Once | INTRAVENOUS | Status: AC | PRN
  Administered 2013-02-01: 30 via INTRAVENOUS

## 2013-02-01 MED ORDER — TECHNETIUM TC 99M SESTAMIBI GENERIC - CARDIOLITE
10.0000 | Freq: Once | INTRAVENOUS | Status: AC | PRN
  Administered 2013-02-01: 10 via INTRAVENOUS

## 2013-02-01 NOTE — Progress Notes (Addendum)
  [facility] [address] [place], [place] [address] [phone]    Cardiology Nuclear Med Study  Matthew [name] is a 72 y.o. male     MRN : [id number]     DOB: Dec 12, 1940  Procedure Date: 02/01/2013  Nuclear Med Background Indication for Stress Test:  Evaluation for Ischemia History:  2010 ECHO: EF:60%, 09/28/09 GXT: Poor exercise tolerance Cardiac Risk Factors: Hypertension, Lipids and Smoker  Symptoms:  Chest Pain, DOE and Fatigue   Nuclear Pre-Procedure Caffeine/Decaff Intake:  None NPO After: 7:00pm   Lungs:clear O2 Sat: 96% on room air. IV 0.9% NS with Angio Cath:  22g  IV Site: R Hand  IV Started by:  Matilde Haymaker, RN  Chest Size (in):  44 Cup Size: n/a  Height: $Remove'5\' 9"'PXpfFze$  (1.753 m)  Weight:  169 lb (76.658 kg)  BMI:  Body mass index is 24.95 kg/(m^2). Tech Comments:  Lopressor taken this am    Nuclear Med Study 1 or 2 day study: 1 day  Stress Test Type:  Lexiscan  Reading MD: Kirk Ruths, MD  Order Authorizing Provider:  Theador Hawthorne  Resting Radionuclide: Technetium 46m Sestamibi  Resting Radionuclide Dose: 11.0 mCi   Stress Radionuclide:  Technetium 61m Sestamibi  Stress Radionuclide Dose: 33.0 mCi           Stress Protocol Rest HR: 62 Stress HR: 93  Rest BP: 127/81 Stress BP: 130/90  Exercise Time (min): n/a METS: n/a   Predicted Max HR: 149 bpm % Max HR: 62.42 bpm Rate Pressure Product: 12090   Dose of Adenosine (mg):  n/a Dose of Lexiscan: 0.4 mg  Dose of Atropine (mg): n/a Dose of Dobutamine: n/a mcg/kg/min (at max HR)  Stress Test Technologist: Perrin Maltese, EMT-P  Nuclear Technologist:  Vedia Pereyra, CNMT     Rest Procedure:  Myocardial perfusion imaging was performed at rest 45 minutes following the intravenous administration of Technetium 55m Sestamibi. Rest ECG: Sinus rhythm with first degree AV block.  Stress Procedure:  The patient received IV Lexiscan 0.4 mg over 15-seconds.  Technetium 52m  Sestamibi injected at 30-seconds. This patient had sob with the Lexiscan injection. Quantitative spect images were obtained after a 45 minute delay. Stress ECG: No significant ST segment change suggestive of ischemia.  QPS Raw Data Images:  Acquisition technically good; normal left ventricular size. Stress Images:  There is decreased uptake in the inferior wall. Rest Images:  There is decreased uptake in the inferior wall. Subtraction (SDS):  No evidence of ischemia. Transient Ischemic Dilatation (Normal <1.22):  n/a Lung/Heart Ratio (Normal <0.45):  0.45  Quantitative Gated Spect Images QGS EDV:  103 ml QGS ESV:  52 ml  Impression Exercise Capacity:  Lexiscan with no exercise. BP Response:  Normal blood pressure response. Clinical Symptoms:  There is dyspnea. ECG Impression:  No significant ST segment change suggestive of ischemia. Comparison with Prior Nuclear Study: No images to compare  Overall Impression:  Low risk stress nuclear study with a fixed, moderate size, medium intensity, inferior defect consistent with thinning; no ischemia.  LV Ejection Fraction: 50%.  LV Wall Motion:  NL LV Function; NL Wall Motion  Kirk Ruths  Low risk study with fixed inferior defect.  This was also seen on Myoview in 2011.  May be old MI or may be attenuation.  Would not cath.   Loralie Champagne 02/02/2013 9:11 AM

## 2013-02-02 NOTE — Progress Notes (Signed)
Pt's daughter,Latonia notified.

## 2013-02-13 ENCOUNTER — Encounter: Payer: Self-pay | Admitting: Family Medicine

## 2013-07-02 ENCOUNTER — Other Ambulatory Visit: Payer: Self-pay | Admitting: *Deleted

## 2013-07-02 MED ORDER — METOPROLOL TARTRATE 25 MG PO TABS: 25.0000 mg | ORAL_TABLET | Freq: Two times a day (BID) | ORAL | Status: DC

## 2013-08-10 ENCOUNTER — Telehealth: Payer: Self-pay | Admitting: *Deleted

## 2013-08-10 NOTE — Telephone Encounter (Signed)
Patient had flu shot done at the kidney doctor in December.Busick, Kevin Fenton

## 2013-09-09 ENCOUNTER — Other Ambulatory Visit: Payer: Self-pay | Admitting: Family Medicine

## 2013-10-08 ENCOUNTER — Encounter: Payer: Self-pay | Admitting: Family Medicine

## 2013-10-08 ENCOUNTER — Ambulatory Visit (INDEPENDENT_AMBULATORY_CARE_PROVIDER_SITE_OTHER): Payer: Medicare Other | Admitting: Family Medicine

## 2013-10-08 VITALS — BP 129/84 | HR 80 | Temp 98.2°F | Ht 69.0 in | Wt 173.0 lb

## 2013-10-08 DIAGNOSIS — Z0000 Encounter for general adult medical examination without abnormal findings: Secondary | ICD-10-CM

## 2013-10-08 DIAGNOSIS — I10 Essential (primary) hypertension: Secondary | ICD-10-CM

## 2013-10-08 DIAGNOSIS — F17200 Nicotine dependence, unspecified, uncomplicated: Secondary | ICD-10-CM

## 2013-10-08 DIAGNOSIS — C61 Malignant neoplasm of prostate: Secondary | ICD-10-CM

## 2013-10-08 DIAGNOSIS — N2581 Secondary hyperparathyroidism of renal origin: Secondary | ICD-10-CM

## 2013-10-08 MED ORDER — ATORVASTATIN CALCIUM 20 MG PO TABS: 20.0000 mg | ORAL_TABLET | Freq: Every day | ORAL | Status: DC

## 2013-10-08 NOTE — Progress Notes (Signed)
   Subjective:    Patient ID: Matthew [name], male    DOB: Jun 04, 1940, 73 y.o.   MRN: [id number]  HPI ELMOND POEHLMAN is a 73 y.o. AAM present to [facility] for annual physical. Patient has a past medical history of hypertension, unstable angina, bleeding esophageal ulcer, secondary hyperparathyroidism, prostate cancer, COPD stage III, hyperlipidemia and tobacco abuse.  Hypertension: Patient is seen by cardiology regularly. He states he's not had any problems with his blood pressure. He currently takes Norvasc and metoprolol and is well controlled. He does take a daily baby aspirin. He is also on cholesterol medication ,pravastatin 40 mg daily.  Chronic kidney disease stage III: Patient is seen regularly by [facility]. His daughter states that he just had all of his lab work completed in March. He was encouraged that his kidney function was stable. He is currently on no nephrotoxic medications. He makes appropriate urine.  Health Maintenance: Patient has history of prostate cancer. He states that he's had no issues with urination, frequency, decrease in stream, straining or urgency. He denies any bowel function changes, constipation, diarrhea, hematochezia, melena or changes in caliber. Patient had a history of colon polyps and is overdue for colonoscopy. Patient refuses further cancer workup including colonoscopy, sigmoidoscopy and fecal occult cards. He states it is "if that is the way he's got go, then that is the way he has to go. " Patient is up-to-date on his tetanus, pneumococcal and flu shots. Patient currently to declines Zostavax.  Review of Systems Negative, with the exception of above mentioned in HPI     Objective:   Physical Exam BP 129/84  Pulse 80  Temp(Src) 98.2 F (36.8 C) (Oral)  Ht $R'5\' 9"'tT$  (1.753 m)  Wt 173 lb (78.472 kg)  BMI 25.54 kg/m2 Gen: Pleasant, African American male, in no acute distress, nontoxic in appearance. HEENT: AT. [place]. Bilateral TM  visualized and normal in appearance. Bilateral eyes without injections or icterus. MMM. Bilateral nares without erythema or swelling. Throat without erythema or exudates.  CV: RRR, no murmurs clicks gallops or rubs Chest: CTAB, no wheeze or crackles Abd: Soft. Flat, NTND. BS present. No Masses palpated.  Ext: No erythema. No edema.  Skin: No rashes, purpura or petechiae.  Neuro:  Normal gait. PERLA. EOMi. Alert. Grossly intact.  Psych: Normal affect, dressed, demeanor and speech.

## 2013-10-08 NOTE — Patient Instructions (Signed)
Please continue to followup as scheduled with your kidney doctor and cardiologist. Please have them forward your labwork to me from your kidney doctor so that I can see if you are in need of having any lab work completed. I will inform you if we need to check any blood work via Development worker, community.  Colorectal Cancer Screening Colorectal cancer screening is done to detect early disease. Colorectal refers to the colon and rectum. The colon and rectum are located at the end of the large intestine (digestive system), and carry your bowel movements out of the body. Screening may be done even if you are not experiencing symptoms.  Colorectal cancer screening checks for:  Polyps. These are small growths in the lining of the colon that can turn cancerous.  Cancer that is already growing. Cancer is a cluster of abnormal cells that can cause problems in the body. REASONS FOR COLORECTAL CANCER SCREENING  It is common for polyps to form in the lining of the colon, especially in older people. These polyps can be cancerous or become cancerous.  Caught early, colorectal cancer is treatable.  Cancer can be life threatening. Detecting or preventing cancer early can save your life and allow you to enjoy life longer. TYPES OF SCREENING  Fecal occult blood testing. A stool sample is examined for blood in the laboratory.  Sigmoidoscopy. A sigmoidoscope is used to examine the rectum and lower colon. A sigmoidoscope is a flexible tube with a camera that is inserted through your anus to examine your lower rectum.  Colonoscopy. The longer colonoscope is used to examine the entire colon. A colonoscope is also a thin, flexible tube with a camera. This test examines the colon and rectum. Other tests include:  Digital rectal exam.  Barium enema.  Stool DNA test.  Virtual colonoscopy is the use of computerized X-ray scan (computed tomography, CT) to take X-ray images of your colon. WHO SHOULD HAVE COLORECTAL CANCER SCREENING?   Screening is recommended for all adults aged 18 to 44 years.  Screening is generally done every 5 to 10 years or more frequently if you have a family history or symptoms.  Screening is rarely recommended in adults aged 12 to 81 years. Screening is not recommended in adults aged 45 years and older. Your caregiver may recommend screening at a younger age and more frequent screening if you have:  A history of colorectal cancer or polyps.  Family members with histories of colorectal cancer or polyps.  Inflammatory bowel disease, such as ulcerative colitis or Crohn's disease.  A type of hereditary colon cancer syndrome. Talk with your caregiver about any symptoms, personal and family history. SYMPTOMS OF COLORECTAL CANCER It is important to discuss the following symptoms with your caregiver. These symptoms may be the result of other conditions and may be easily treated:  Rectal bleeding.  Blood in your stool.  Changes in bowel movements (hard or loose stools). These changes may last several weeks.  Abdominal cramping.  Feeling the pressure to have a bowel movement when there is no bowel movement.  Feeling tired or weak.  Unexplained weight loss.  Unexplained low red blood cell count. This may also be called iron deficiency anemia. HOME CARE INSTRUCTIONS   Follow up with your caregiver as directed.  Follow all instructions for preparation before your test as well as after. PREVENTION  Following healthy lifestyle habits each day can reduce your chance of getting colorectal cancer and many other types of cancer:  Eat a healthy, well-balanced diet  rich in fruits and vegetables and low in fats, sugars and cholesterol.  Stay active. Try to exercise at least 4 to 6 times per week for 30 minutes.  Maintain a healthy weight. Ask your caregiver what a healthy weight range is for you.  Women should only drink 1 alcoholic drink per day. Men should only drink 2 alcoholic drinks per  day.  Quit smoking. SEEK MEDICAL CARE IF:   You experience abdominal or rectal symptoms (see Symptoms of Colorectal Cancer).  Your gastrointestinal issues (constipation, diarrhea) do not go away as expected.  You have questions or concerns. FOR MORE INFORMATION  American Academy of Family Physicians www.familydoctor.org  Centers for Disease Control and Prevention http://www.wolf.info/  [place] Preventive Services Task Force www.uspreventiveservicestaskforce.[place] www.cancer.org MAKE SURE YOU:   Understand these instructions.  Will watch your condition.  Will get help right away if you are not doing well or get worse. Always follow up with your caregiver to find out the results of your tests. Not all test results may be available during your visit. If your test results are not back during the visit, make an appointment with your caregiver to find out the results. Do not assume everything is normal if you have not heard from your caregiver or the medical facility. It is important for you to follow up on all of your test results.  Document Released: 10/31/2009 Document Revised: 08/05/2011 Document Reviewed: 10/31/2009 [facility] Patient Information 2014 [place].

## 2013-10-11 DIAGNOSIS — Z0000 Encounter for general adult medical examination without abnormal findings: Secondary | ICD-10-CM | POA: Insufficient documentation

## 2013-10-11 NOTE — Assessment & Plan Note (Signed)
Patient was advised to have a colonoscopy completed. Patient adamantly declines and states he'll never have a colonoscopy, sigmoidoscopy or complete fecal occult cards. Patient is due for the shingles vaccination but he declines. All other vaccinations are up-to-date. A lab work has been completed by [facility] and will be faxed to this department. At that time our review to see if there is anything that he additionally needs lab work for her.

## 2013-10-11 NOTE — Assessment & Plan Note (Addendum)
Patient does not desire to quit today. [place] smoking cessation help if he desires to quit.

## 2013-10-11 NOTE — Assessment & Plan Note (Signed)
Pressure well controlled today we'll continue current regimen of amlodipine and metoprolol.

## 2013-10-15 ENCOUNTER — Telehealth: Payer: Self-pay | Admitting: Family Medicine

## 2013-10-15 NOTE — Telephone Encounter (Signed)
Please call Mr Matthew [name]'s daughter and explain to her with is history of prostate cancer, her should continue to follow with his urologist for screenings. Thanks.

## 2013-10-15 NOTE — Telephone Encounter (Signed)
Daughter called because [facility] called her to say that her dad needs a PSA test done. She is wondering if this can be done at our office or does he have to go to [facility]. Please call her and advise jw

## 2013-10-20 ENCOUNTER — Other Ambulatory Visit: Payer: Self-pay | Admitting: *Deleted

## 2013-10-20 MED ORDER — AMLODIPINE BESYLATE 10 MG PO TABS: 10.0000 mg | ORAL_TABLET | Freq: Every day | ORAL | Status: DC

## 2013-10-20 NOTE — Telephone Encounter (Signed)
Covering for PCP Dr. Raoul Pitch. Refilled Norvasc; recently seen with good control of BP with this and metoprolol. F/u with PCP as needed. Thanks. --CMS

## 2014-03-29 ENCOUNTER — Encounter: Payer: Self-pay | Admitting: *Deleted

## 2014-03-30 ENCOUNTER — Ambulatory Visit (INDEPENDENT_AMBULATORY_CARE_PROVIDER_SITE_OTHER): Payer: Medicare Other | Admitting: Cardiology

## 2014-03-30 ENCOUNTER — Encounter: Payer: Self-pay | Admitting: Cardiology

## 2014-03-30 VITALS — BP 127/85 | HR 74 | Ht 69.0 in | Wt 172.0 lb

## 2014-03-30 DIAGNOSIS — F17200 Nicotine dependence, unspecified, uncomplicated: Secondary | ICD-10-CM

## 2014-03-30 DIAGNOSIS — N183 Chronic kidney disease, stage 3 unspecified: Secondary | ICD-10-CM

## 2014-03-30 DIAGNOSIS — R0789 Other chest pain: Secondary | ICD-10-CM

## 2014-03-30 DIAGNOSIS — Z720 Tobacco use: Secondary | ICD-10-CM

## 2014-03-30 DIAGNOSIS — I10 Essential (primary) hypertension: Secondary | ICD-10-CM

## 2014-03-30 NOTE — Patient Instructions (Signed)
Your physician wants you to follow-up in: 1 year with Dr McLean. (November 2016).  You will receive a reminder letter in the mail two months in advance. If you don't receive a letter, please call our office to schedule the follow-up appointment.  

## 2014-03-31 NOTE — Progress Notes (Signed)
Patient ID: Matthew [name], male   DOB: Sep 07, 1940, 73 y.o.   MRN: [id number] PCP: Dr. Raoul Pitch  73 yo with history of CKD and HTN presents for followup. Patient was hospitalized in 2/11 with a chest pain episode. He was sitting on the sofa and developed a severe ache in his central chest that lasted several hours. He went to the ER and the pain resolved with morphine. Cardiac enzymes were negative and he was discharged home the next day. I had the patient do an ETT but he tired quickly, only walking for 2 minutes 30 seconds. Therefore, we changed the test to a [employer]. 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 However, cannot rule out attenuation from significant uptake below the diaphragm. Given CKD and lack of ongoing symptoms, he was treated medically.  In 8/14, he again had atypical chest pain.  Lexiscan Cardiolite in 9/14 showed EF 50% with fixed inferior defect, no ischemia.   Patient has been doing well lately.  No further chest pain.  No exertional dyspnea. He is still smoking his pipe.   ECG: NSR, normal  Labs (5/11): LDL 112, HDL 63  Labs (6/11): K 4.3, creatinine 1.7  Labs (2/14): K 4.4, creatinine 1.47 Labs (4/15): K 4.9, creatinine 1.4, LDL 71  Allergies (verified):  No Known Drug Allergies   Past Medical History:  1. Prostate cancer: robotic prostectomy (3/10), acute blood loss s/p transfusion  2. CKD: Thought to be due to HTN. Creatinine has ranged 1.4-1.8.  3. HTN  4. Inguinal hernia s/p repair 03/2008  5. hypercholsterolemia  6. esophageal ulcer 2010  7. Chest pain: ETT (5/11): patient only able to exercise 2'30" due to fatigue. Lexiscan myoview (5/11) showed EF 57%, inferior perfusion defect suggestive of infarction with peri-infarction ischemia. Also could be artifact from significant activity in the gut.  Lexiscan Cardiolite (9/14) with EF 50%, fixed inferior defect, no ischemia.  8. COPD  Family History:  h/o  HTN, 1 brother, 5 sisters, father- age 16 `healthy`, grandfather with prostate CA (died of comorbid illness), mother dead at 46 w/ kidney DZ  No premature CAD.   Social History:  Single, from [place], [place]. Likes to walk and watch TV. Smokes a pipe occ. No EtoH, no drugs. 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  ROS: All systems reviewed and negative except as per HPI.   Current Outpatient Prescriptions  Medication Sig Dispense Refill  . amLODipine (NORVASC) 10 MG tablet Take 1 tablet (10 mg total) by mouth daily. 90 tablet 3  . aspirin 81 MG tablet Take 81 mg by mouth daily.      . metoprolol tartrate (LOPRESSOR) 25 MG tablet Take 1 tablet (25 mg total) by mouth 2 (two) times daily. 180 tablet 3  . nitroGLYCERIN (NITROSTAT) 0.4 MG SL tablet Place 1 tablet (0.4 mg total) under the tongue as directed. 20 tablet 0  . omeprazole (PRILOSEC) 20 MG capsule TAKE ONE CAPSULE EVERY DAY 90 capsule 2  . pravastatin (PRAVACHOL) 40 MG tablet Take 40 mg by mouth daily.     No current facility-administered medications for this visit.    BP 127/85 mmHg  Pulse 74  Ht $R'5\' 9"'ns$  (1.753 m)  Wt 172 lb (78.019 kg)  BMI 25.39 kg/m2 General: NAD Neck: No JVD, no thyromegaly or thyroid nodule.  Lungs: Clear to auscultation bilaterally with normal respiratory effort. CV: Nondisplaced PMI.  Heart regular S1/S2, no S3/S4, no murmur.  No peripheral edema.  No carotid bruit.  Normal pedal pulses.  Abdomen: Soft, nontender, no hepatosplenomegaly, no distention.  Neurologic: Alert and oriented x 3.  Psych: Normal affect. Extremities: No clubbing or cyanosis.  MSK: Chest wall not tender.   Assessment/Plan: 1. Chest pain: Atypical chest pain.  Prior stress Cardiolites in 2011 and 9/14 concerning for prior inferior MI versus attenuation but no ischemia.  Given CKD and no further symptoms, he did not have cardiac catheterization but was treated with ASA, statin, and beta blocker.  - Continue ASA,  metoprolol, and statin. 2. HTN: BP is controlled on current regimen.  3. Hyperlipidemia: Good LDL in 4/15.  Continue statin.   4. CKD: Creatinine has been stable over time.   5. Smoking: I strongly encouraged him to stop smoking.   Loralie Champagne 03/31/2014

## 2014-06-16 ENCOUNTER — Other Ambulatory Visit: Payer: Self-pay | Admitting: Family Medicine

## 2014-06-28 ENCOUNTER — Other Ambulatory Visit: Payer: Self-pay | Admitting: Family Medicine

## 2014-08-08 DIAGNOSIS — D509 Iron deficiency anemia, unspecified: Secondary | ICD-10-CM | POA: Diagnosis not present

## 2014-08-08 DIAGNOSIS — N184 Chronic kidney disease, stage 4 (severe): Secondary | ICD-10-CM | POA: Diagnosis not present

## 2014-08-08 DIAGNOSIS — E780 Pure hypercholesterolemia: Secondary | ICD-10-CM | POA: Diagnosis not present

## 2014-08-08 DIAGNOSIS — N2581 Secondary hyperparathyroidism of renal origin: Secondary | ICD-10-CM | POA: Diagnosis not present

## 2014-08-08 DIAGNOSIS — N185 Chronic kidney disease, stage 5: Secondary | ICD-10-CM | POA: Diagnosis not present

## 2014-09-29 ENCOUNTER — Other Ambulatory Visit: Payer: Self-pay | Admitting: Family Medicine

## 2014-09-29 ENCOUNTER — Other Ambulatory Visit: Payer: Self-pay | Admitting: *Deleted

## 2014-09-29 MED ORDER — METOPROLOL TARTRATE 25 MG PO TABS: 25.0000 mg | ORAL_TABLET | Freq: Two times a day (BID) | ORAL | Status: DC

## 2014-10-03 ENCOUNTER — Other Ambulatory Visit: Payer: Self-pay | Admitting: Family Medicine

## 2014-11-01 DIAGNOSIS — N2581 Secondary hyperparathyroidism of renal origin: Secondary | ICD-10-CM | POA: Diagnosis not present

## 2014-11-01 DIAGNOSIS — N184 Chronic kidney disease, stage 4 (severe): Secondary | ICD-10-CM | POA: Diagnosis not present

## 2014-11-01 DIAGNOSIS — Z7689 Persons encountering health services in other specified circumstances: Secondary | ICD-10-CM | POA: Diagnosis not present

## 2014-11-01 DIAGNOSIS — I129 Hypertensive chronic kidney disease with stage 1 through stage 4 chronic kidney disease, or unspecified chronic kidney disease: Secondary | ICD-10-CM | POA: Diagnosis not present

## 2014-11-03 ENCOUNTER — Emergency Department (HOSPITAL_COMMUNITY): Payer: Medicare Other

## 2014-11-03 ENCOUNTER — Emergency Department (HOSPITAL_COMMUNITY)
Admission: EM | Admit: 2014-11-03 | Discharge: 2014-11-04 | Disposition: A | Discharge status: Home / Self Care | Payer: Medicare Other | Attending: Emergency Medicine | Admitting: Emergency Medicine

## 2014-11-03 ENCOUNTER — Encounter (HOSPITAL_COMMUNITY): Payer: Self-pay | Admitting: *Deleted

## 2014-11-03 DIAGNOSIS — E785 Hyperlipidemia, unspecified: Secondary | ICD-10-CM | POA: Diagnosis not present

## 2014-11-03 DIAGNOSIS — W01198A Fall on same level from slipping, tripping and stumbling with subsequent striking against other object, initial encounter: Secondary | ICD-10-CM | POA: Insufficient documentation

## 2014-11-03 DIAGNOSIS — S0990XA Unspecified injury of head, initial encounter: Secondary | ICD-10-CM | POA: Diagnosis not present

## 2014-11-03 DIAGNOSIS — N183 Chronic kidney disease, stage 3 (moderate): Secondary | ICD-10-CM | POA: Insufficient documentation

## 2014-11-03 DIAGNOSIS — I129 Hypertensive chronic kidney disease with stage 1 through stage 4 chronic kidney disease, or unspecified chronic kidney disease: Secondary | ICD-10-CM | POA: Insufficient documentation

## 2014-11-03 DIAGNOSIS — Y9301 Activity, walking, marching and hiking: Secondary | ICD-10-CM | POA: Diagnosis not present

## 2014-11-03 DIAGNOSIS — Z720 Tobacco use: Secondary | ICD-10-CM | POA: Diagnosis not present

## 2014-11-03 DIAGNOSIS — I10 Essential (primary) hypertension: Secondary | ICD-10-CM | POA: Diagnosis not present

## 2014-11-03 DIAGNOSIS — Y998 Other external cause status: Secondary | ICD-10-CM | POA: Insufficient documentation

## 2014-11-03 DIAGNOSIS — Z951 Presence of aortocoronary bypass graft: Secondary | ICD-10-CM | POA: Diagnosis not present

## 2014-11-03 DIAGNOSIS — S199XXA Unspecified injury of neck, initial encounter: Secondary | ICD-10-CM | POA: Diagnosis not present

## 2014-11-03 DIAGNOSIS — Z79899 Other long term (current) drug therapy: Secondary | ICD-10-CM | POA: Insufficient documentation

## 2014-11-03 DIAGNOSIS — I200 Unstable angina: Secondary | ICD-10-CM | POA: Insufficient documentation

## 2014-11-03 DIAGNOSIS — Z8546 Personal history of malignant neoplasm of prostate: Secondary | ICD-10-CM | POA: Diagnosis not present

## 2014-11-03 DIAGNOSIS — S0191XA Laceration without foreign body of unspecified part of head, initial encounter: Secondary | ICD-10-CM | POA: Diagnosis not present

## 2014-11-03 DIAGNOSIS — Z7982 Long term (current) use of aspirin: Secondary | ICD-10-CM | POA: Insufficient documentation

## 2014-11-03 DIAGNOSIS — Y92009 Unspecified place in unspecified non-institutional (private) residence as the place of occurrence of the external cause: Secondary | ICD-10-CM | POA: Insufficient documentation

## 2014-11-03 DIAGNOSIS — W19XXXA Unspecified fall, initial encounter: Secondary | ICD-10-CM

## 2014-11-03 NOTE — Discharge Instructions (Signed)

## 2014-11-03 NOTE — ED Provider Notes (Signed)
I saw and evaluated the patient, reviewed the resident's note and I agree with the findings and plan.   EKG Interpretation   Date/Time:  Thursday November 03 2014 22:46:52 EDT Ventricular Rate:  76 PR Interval:  216 QRS Duration: 99 QT Interval:  400 QTC Calculation: 450 R Axis:   25 Text Interpretation:  Sinus rhythm Prolonged PR interval No significant  change since last tracing Confirmed by Yemariam Magar  MD, Dajiah Kooi ([id number]) on  11/03/2014 11:34:32 PM      73yM s/p fall while intoxicated. Nonfocal neuro exam. Negative imaging. Small abrasion to posterior scalp. Doesn't require any closure. Expect to heal w/o complication. It has been determined that no acute conditions requiring further emergency intervention are present at this time. The patient has been advised of the diagnosis and plan. I reviewed any labs and imaging including any potential incidental findings. We have discussed signs and symptoms that warrant return to the ED and they are listed in the discharge instructions.    Virgel Manifold, MD 11/03/14 [phone]

## 2014-11-03 NOTE — ED Notes (Signed)
Pt. Was walking to the bathroom and fell hit his head. Pt. Has a laceration to the back of the head. ETOH on board

## 2014-11-03 NOTE — ED Provider Notes (Signed)
CSN: [id number]     Arrival date & time 11/03/14  2122 History   First MD Initiated Contact with Patient 11/03/14 2124     Chief Complaint  Patient presents with  . Fall   HPI  Matthew [name] is a 74yo man with PMHx of CAD s/p CABG, HTN, and CKD Stage III presenting to the ED s/p a fall. Patient reports he was drinking brandy with his friends to celebrate his birthday early. He then was walking to the bathroom when he suddenly fell and hit his head. He states he lost consciousness, unsure of the duration. He denies slipping. He denies dizziness or palpations before the fall. He denies any history of syncope or seizures. He reports once he regained consciousness he saw a lot of blood on the floor and immediately called his daughter. He reports he drinks alcohol occasionally, denies daily use. He denies headache, weakness, changes in vision, or numbness/tingling.   Past Medical History  Diagnosis Date  . Coronary atherosclerosis of artery bypass graft 10/20/2009    Qualifier: Diagnosis of  By: Mack Guise    . HYPERLIPIDEMIA 08/29/2009    Qualifier: Diagnosis of  By: Lynden Ang   Statin therapy   . HYPERTENSION, BENIGN SYSTEMIC 07/24/2006    Qualifier: Diagnosis of  By: Herma Ard    . Unstable angina 01/15/2013  . ADENOCARCINOMA, PROSTATE 10/25/2008    Qualifier: Diagnosis of  By: Buelah Manis MD, Lonell Grandchild   S/p robotic prostatectomy in 2010   . ALLERGIC RHINITIS, SEASONAL 10/16/2009    Qualifier: Diagnosis of  By: Buelah Manis MD, Lonell Grandchild    . CHRONIC KIDNEY DISEASE STAGE 3 07/24/2006    Qualifier: Diagnosis of  By: Herma Ard   Followed by Dr. Louanna Raw at [facility], avoid nephrotoxic agents   . Decreased hearing 01/15/2013  . ESOPHAGEAL ULCER, WITH BLEEDING 10/25/2008    Qualifier: Diagnosis of  By: Buelah Manis MD, Lonell Grandchild    . SECONDARY HYPERPARATHYROIDISM 05/04/2007    Qualifier: Diagnosis of  By: [place], Bonnie    . TOBACCO ABUSE 08/10/2007    Qualifier: Diagnosis of  By: Girard Cooter MD, [place]    . Transfusion history 2010   Past Surgical History  Procedure Laterality Date  . Coronary artery bypass graft    . Prostatectomy  2010    robotic  . Inguinal hernia repair  2009   Family History  Problem Relation Age of Onset  . Hypertension    . Prostate cancer      grandfather  . Kidney disease Mother   . Hypertension Sister     all 5  . Hypertension Brother    History  Substance Use Topics  . Smoking status: Current Every Day Smoker    Types: Pipe  . Smokeless tobacco: Not on file  . Alcohol Use: Not on file    Review of Systems General: Denies fever, chills, night sweats, changes in weight, changes in appetite HEENT: Denies ear pain, rhinorrhea, sore throat CV: Denies CP, SOB, orthopnea Pulm: Denies SOB, cough, wheezing GI: Denies abdominal pain, nausea, vomiting, diarrhea, constipation, melena, hematochezia GU: Denies dysuria, hematuria, frequency Msk: Denies muscle cramps, joint pains Neuro: See above  Skin: See above    Allergies  Review of patient's allergies indicates no known allergies.  Home Medications   Prior to Admission medications   Medication Sig Start Date End Date Taking? Authorizing Provider  amLODipine (NORVASC) 10 MG tablet TAKE 1 TABLET (10 MG TOTAL) BY MOUTH DAILY. 10/03/14   Renee  A Kuneff, DO  aspirin 81 MG tablet Take 81 mg by mouth daily.      Historical Provider, MD  metoprolol tartrate (LOPRESSOR) 25 MG tablet Take 1 tablet (25 mg total) by mouth 2 (two) times daily. 09/29/14   Renee A Kuneff, DO  nitroGLYCERIN (NITROSTAT) 0.4 MG SL tablet Place 1 tablet (0.4 mg total) under the tongue as directed. 01/15/13   Montez Morita, MD  omeprazole (PRILOSEC) 20 MG capsule TAKE ONE CAPSULE EVERY DAY 06/16/14   Renee A Kuneff, DO  omeprazole (PRILOSEC) 20 MG capsule TAKE ONE CAPSULE EVERY DAY 06/30/14   Renee A Kuneff, DO  pravastatin (PRAVACHOL) 40 MG tablet Take 40 mg by mouth daily.    Historical Provider, MD   BP 139/88  mmHg  Pulse 79  Temp(Src) 98.4 F (36.9 C)  Resp 10  Ht 5' 9.5" (1.765 m)  Wt 170 lb (77.111 kg)  BMI 24.75 kg/m2  SpO2 99% Physical Exam General: elderly man sitting up in bed, smells of alcohol  HEENT: Posterior head with large amount of dried blood. No active bleeding. Tenderness to palpation. EOMI, PERRL. Mucus membranes moist.  CV: RRR, no m/g/r Pulm: CTA bilaterally, breaths non-labored Abd: BS+, soft, non-tender, non-distended Ext: warm, no edema Neuro: alert and oriented x 3. Smile symmetric. Strength 5/5 in upper and lower extremities bilaterally. Tongue does not deviate.   ED Course  Procedures (including critical care time) Labs Review Labs Reviewed - No data to display  Imaging Review Ct Head Wo Contrast  11/03/2014   CLINICAL DATA:  [place] while walking to the bathroom at home.  EXAM: CT HEAD WITHOUT CONTRAST  CT CERVICAL SPINE WITHOUT CONTRAST  TECHNIQUE: Multidetector CT imaging of the head and cervical spine was performed following the standard protocol without intravenous contrast. Multiplanar CT image reconstructions of the cervical spine were also generated.  COMPARISON:  None.  FINDINGS: CT HEAD FINDINGS  There is no intracranial hemorrhage, mass or evidence of acute infarction. There is moderately severe generalized atrophy. There is severe chronic microvascular ischemic change. There is no significant extra-axial fluid collection. Skull base and calvarium are intact. There is fluid in the right sphenoid sinus.  No acute intracranial findings are evident.  CT CERVICAL SPINE FINDINGS  The vertebral column, pedicles and facet articulations are intact. There is no evidence of acute fracture. No acute soft tissue abnormalities are evident.  Mild degenerative disc changes are present at C4-5 and C5-6.  IMPRESSION: 1. Negative for acute intracranial traumatic injury. 2. Moderately severe generalized atrophy and chronic microvascular ischemic change. 3. Negative for acute cervical  spine fracture   Electronically Signed   By: Andreas Newport M.D.   On: 11/03/2014 23:06   Ct Cervical Spine Wo Contrast  11/03/2014   CLINICAL DATA:  [place] while walking to the bathroom at home.  EXAM: CT HEAD WITHOUT CONTRAST  CT CERVICAL SPINE WITHOUT CONTRAST  TECHNIQUE: Multidetector CT imaging of the head and cervical spine was performed following the standard protocol without intravenous contrast. Multiplanar CT image reconstructions of the cervical spine were also generated.  COMPARISON:  None.  FINDINGS: CT HEAD FINDINGS  There is no intracranial hemorrhage, mass or evidence of acute infarction. There is moderately severe generalized atrophy. There is severe chronic microvascular ischemic change. There is no significant extra-axial fluid collection. Skull base and calvarium are intact. There is fluid in the right sphenoid sinus.  No acute intracranial findings are evident.  CT CERVICAL SPINE FINDINGS  The vertebral  column, pedicles and facet articulations are intact. There is no evidence of acute fracture. No acute soft tissue abnormalities are evident.  Mild degenerative disc changes are present at C4-5 and C5-6.  IMPRESSION: 1. Negative for acute intracranial traumatic injury. 2. Moderately severe generalized atrophy and chronic microvascular ischemic change. 3. Negative for acute cervical spine fracture   Electronically Signed   By: Andreas Newport M.D.   On: 11/03/2014 23:06     EKG Interpretation   Date/Time:  Thursday November 03 2014 22:46:52 EDT Ventricular Rate:  76 PR Interval:  216 QRS Duration: 99 QT Interval:  400 QTC Calculation: 450 R Axis:   25 Text Interpretation:  Sinus rhythm Prolonged PR interval No significant  change since last tracing Confirmed by Wilson Singer  MD, [place] ([id number]) on  11/03/2014 11:34:32 PM      MDM   Final diagnoses:  Fall at home, initial encounter  Laceration of head, initial encounter    74yo man presenting with a head laceration after having a  fall at home. History is unclear as to whether he had a syncopal event but seems possible given that he suddenly lost consciousness. Will get EKG, CT head and CT neck.   EKG with no significant changes compared to prior. CT head and neck without any acute changes or evidence of hemorrhage. Patient is alert and oriented x 3, asking to eat. He was recommended to decrease his alcohol use and to follow up with his PCP. He was discharged home with his family.     Juliet Rude, MD 11/03/14 [place], MD 11/09/14 [phone]

## 2014-11-04 NOTE — ED Notes (Signed)
Pt. Left with all belongings 

## 2015-01-16 ENCOUNTER — Other Ambulatory Visit: Payer: Self-pay | Admitting: *Deleted

## 2015-01-16 MED ORDER — AMLODIPINE BESYLATE 10 MG PO TABS: ORAL_TABLET | ORAL | Status: DC

## 2015-02-13 DIAGNOSIS — D509 Iron deficiency anemia, unspecified: Secondary | ICD-10-CM | POA: Diagnosis not present

## 2015-02-13 DIAGNOSIS — N2581 Secondary hyperparathyroidism of renal origin: Secondary | ICD-10-CM | POA: Diagnosis not present

## 2015-02-13 DIAGNOSIS — N184 Chronic kidney disease, stage 4 (severe): Secondary | ICD-10-CM | POA: Diagnosis not present

## 2015-03-10 ENCOUNTER — Other Ambulatory Visit: Payer: Self-pay | Admitting: *Deleted

## 2015-03-12 MED ORDER — OMEPRAZOLE 20 MG PO CPDR: 20.0000 mg | DELAYED_RELEASE_CAPSULE | Freq: Every day | ORAL | Status: DC

## 2015-04-03 ENCOUNTER — Ambulatory Visit (INDEPENDENT_AMBULATORY_CARE_PROVIDER_SITE_OTHER): Payer: Medicare Other | Admitting: Family Medicine

## 2015-04-03 ENCOUNTER — Encounter: Payer: Self-pay | Admitting: Family Medicine

## 2015-04-03 VITALS — BP 128/70 | HR 55 | Temp 98.2°F | Ht 70.0 in | Wt 175.0 lb

## 2015-04-03 DIAGNOSIS — Z23 Encounter for immunization: Secondary | ICD-10-CM | POA: Diagnosis not present

## 2015-04-03 DIAGNOSIS — E785 Hyperlipidemia, unspecified: Secondary | ICD-10-CM | POA: Diagnosis not present

## 2015-04-03 DIAGNOSIS — N183 Chronic kidney disease, stage 3 (moderate): Secondary | ICD-10-CM | POA: Diagnosis not present

## 2015-04-03 DIAGNOSIS — Z0000 Encounter for general adult medical examination without abnormal findings: Secondary | ICD-10-CM | POA: Diagnosis not present

## 2015-04-03 DIAGNOSIS — I10 Essential (primary) hypertension: Secondary | ICD-10-CM

## 2015-04-03 LAB — LIPID PANEL
CHOL/HDL RATIO: 3 ratio (ref <=5.0)
Cholesterol: 179 mg/dL (ref 125–200)
HDL: 60 mg/dL (ref >=40)
LDL CALC: 102 mg/dL (ref <130)
Triglycerides: 85 mg/dL (ref <150)
VLDL: 17 mg/dL (ref <30)

## 2015-04-03 LAB — BASIC METABOLIC PANEL WITH GFR
BUN: 16 mg/dL (ref 7–25)
CHLORIDE: 102 mmol/L (ref 98–110)
CO2: 24 mmol/L (ref 20–31)
Calcium: 8.5 mg/dL — ABNORMAL LOW (ref 8.6–10.3)
Creat: 1.46 mg/dL — ABNORMAL HIGH (ref 0.70–1.18)
GFR, Est African American: 54 mL/min — ABNORMAL LOW (ref >=60)
GFR, Est Non African American: 47 mL/min — ABNORMAL LOW (ref >=60)
Glucose, Bld: 114 mg/dL — ABNORMAL HIGH (ref 65–99)
Potassium: 4.4 mmol/L (ref 3.5–5.3)
SODIUM: 136 mmol/L (ref 135–146)

## 2015-04-03 MED ORDER — TETANUS-DIPHTH-ACELL PERTUSSIS 5-2.5-18.5 LF-MCG/0.5 IM SUSP: 0.5000 mL | Freq: Once | INTRAMUSCULAR | Status: DC

## 2015-04-03 NOTE — Progress Notes (Signed)
   Subjective:    Patient ID: Matthew [name], male    DOB: [birth year], 74 y.o.   MRN: [id number]  HPI  CC: annual  # Healthcare maintenance:  Current pipe smoker -- not interested in quitting  Drinks some alcohol, monthly or less, 1-2 drinks  Has used marijuana  Exercise: walks  Colonoscopy: had 1 in the past (says it was normal), he does not have any interest in getting another one.   # Hypertension  Taking medicines as prescribed  No side effects from medicines  # Hyperlipidemia  Taking medicine as prescribed  No side effects from medicine  Social Hx: current smoker  Review of Systems   Full ROS sheet reviewed and is all negative  Past medical history, surgical, family, and social history reviewed and updated in the EMR as appropriate. Objective:  BP 128/70 mmHg  Pulse 55  Temp(Src) 98.2 F (36.8 C) (Oral)  Ht $R'5\' 10"'Cn$  (1.778 m)  Wt 175 lb (79.379 kg)  BMI 25.11 kg/m2 Vitals and nursing note reviewed  General: NAD HEENT: PERRL, EOMI. Arcus senilis bilaterally. Normal oropharyngeal cavity, no lesions Neck: no thyromegaly. Supple. CV: Bradycardic, regular rhythm, normal heart sounds, no murmur appreciated. 2+ radial pulses bilaterally  Resp: clear to auscultation bilaterally, normal effort Abdomen: soft, nontender, nondistended, normal bowel sounds  Extremities: no edema Neuro: alert and oriented Psych: normal thought content and speech.  Assessment & Plan:  Healthcare maintenance Flu, PCV 13 given in office today. Sent tdap to pharmacy. Declines zostavax. Declines colonoscopy referral.  HYPERTENSION, BENIGN SYSTEMIC At goal. Continue current regimen. Check BMP today. Follow up 1 year or sooner if BP becomes uncontrolled.  Hyperlipidemia Stable on current pravastatin. Check Lipid panel today. Follow up 1 year.

## 2015-04-03 NOTE — Assessment & Plan Note (Signed)
Flu, PCV 13 given in office today. Sent tdap to pharmacy. Declines zostavax. Declines colonoscopy referral.

## 2015-04-03 NOTE — Assessment & Plan Note (Addendum)
At goal. Continue current regimen. Check BMP today. Follow up 1 year or sooner if BP becomes uncontrolled.

## 2015-04-03 NOTE — Assessment & Plan Note (Signed)
Stable on current pravastatin. Check Lipid panel today. Follow up 1 year.

## 2015-04-04 ENCOUNTER — Encounter: Payer: Self-pay | Admitting: Family Medicine

## 2015-08-09 DIAGNOSIS — Z7689 Persons encountering health services in other specified circumstances: Secondary | ICD-10-CM | POA: Diagnosis not present

## 2015-08-09 DIAGNOSIS — N184 Chronic kidney disease, stage 4 (severe): Secondary | ICD-10-CM | POA: Diagnosis not present

## 2015-08-09 DIAGNOSIS — I129 Hypertensive chronic kidney disease with stage 1 through stage 4 chronic kidney disease, or unspecified chronic kidney disease: Secondary | ICD-10-CM | POA: Diagnosis not present

## 2015-08-09 DIAGNOSIS — N2581 Secondary hyperparathyroidism of renal origin: Secondary | ICD-10-CM | POA: Diagnosis not present

## 2015-08-28 ENCOUNTER — Encounter: Payer: Self-pay | Admitting: Cardiology

## 2015-08-28 ENCOUNTER — Ambulatory Visit (INDEPENDENT_AMBULATORY_CARE_PROVIDER_SITE_OTHER): Payer: Medicare Other | Admitting: Cardiology

## 2015-08-28 VITALS — BP 110/74 | HR 65 | Ht 69.0 in | Wt 177.0 lb

## 2015-08-28 DIAGNOSIS — I10 Essential (primary) hypertension: Secondary | ICD-10-CM | POA: Diagnosis not present

## 2015-08-28 NOTE — Progress Notes (Signed)
08/28/2015 Matthew [name]   1940-06-27  [id number]  Primary Physician Tawanna Sat, MD Primary Cardiologist: Dr. Aundra Dubin (last seen in 2015)   Reason for Visit/CC: F/u for Presumed CAD and HTN  HPI:  The patient is a 75 y/o AA male with CKD, followed by Dr. Aundra Dubin. He was lost to f/u and has not been seen since 2015. He has been followed for HTN and presumed CAD. In 2011, he underwent w/u for chest pain. He ruled out with negative enzymes. Dr. Aundra Dubin ordered an ETT but he tired quickly, only walking for 2 minutes and 30 seconds. Therefore, his test was changedt to a [employer]. 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 However, attenuation from significant uptake below the diaphragm could not be excluded. Given CKD and lack of ongoing symptoms, he was treated medically. In 8/14, he again had atypical chest pain. Lexiscan Cardiolite in 9/14 showed EF 50% with fixed inferior defect, no ischemia. He has been managed medically with ASA, a statin and BB. He is followed at [facility] by Dr. Marval Regal. His PCP is Dr. Lamar Benes.   He reports he has done well since his last OV. He denies any CP or dyspnea. No LEE, orthopnea, PND, palpitations, dizziness, syncope/ near syncope. No limitation with exertion. He is able to walk to [place] which is about 2 blocks from his house without CP and w/o having to stop to rest. He has been fully compliant with his meds. His PCP has been refilling his meds.    Current Outpatient Prescriptions  Medication Sig Dispense Refill  . amLODipine (NORVASC) 10 MG tablet TAKE 1 TABLET (10 MG TOTAL) BY MOUTH DAILY. 90 tablet 2  . aspirin 81 MG tablet Take 81 mg by mouth daily.      . metoprolol tartrate (LOPRESSOR) 25 MG tablet Take 1 tablet (25 mg total) by mouth 2 (two) times daily. 180 tablet 3  . nitroGLYCERIN (NITROSTAT) 0.4 MG SL tablet Place 1 tablet (0.4 mg total) under the tongue as directed. (Patient taking  differently: Place 0.4 mg under the tongue every 5 (five) minutes as needed for chest pain. ) 20 tablet 0  . omeprazole (PRILOSEC) 20 MG capsule Take 1 capsule (20 mg total) by mouth daily. 90 capsule 2  . pravastatin (PRAVACHOL) 40 MG tablet Take 40 mg by mouth daily.    . Tdap (BOOSTRIX) 5-2.5-18.5 LF-MCG/0.5 injection Inject 0.5 mLs into the muscle once. 0.5 mL 0   No current facility-administered medications for this visit.    No Known Allergies  Social History   Social History  . Marital Status: Legally Separated    Spouse Name: N/A  . Number of Children: N/A  . Years of Education: N/A   Occupational History  . Not on file.   Social History Main Topics  . Smoking status: Current Every Day Smoker    Types: Pipe  . Smokeless tobacco: Never Used  . Alcohol Use: Yes  . Drug Use: No  . Sexual Activity: Not on file   Other Topics Concern  . Not on file   Social History Narrative     Review of Systems: General: negative for chills, fever, night sweats or weight changes.  Cardiovascular: negative for chest pain, dyspnea on exertion, edema, orthopnea, palpitations, paroxysmal nocturnal dyspnea or shortness of breath Dermatological: negative for rash Respiratory: negative for cough or wheezing Urologic: negative for hematuria Abdominal: negative for nausea, vomiting, diarrhea, bright red blood per rectum, melena, or  hematemesis Neurologic: negative for visual changes, syncope, or dizziness All other systems reviewed and are otherwise negative except as noted above.    Blood pressure 110/74, pulse 65, height $RemoveBe'5\' 9"'JUerEwRhB$  (1.753 m), weight 177 lb (80.287 kg), SpO2 97 %.  General appearance: alert, cooperative, no distress and dentition in poor repair Neck: no carotid bruit and no JVD Lungs: clear to auscultation bilaterally Heart: regular rate and rhythm, S1, S2 normal, no murmur, click, rub or gallop Extremities: no LEE Pulses: 2+ and symmetric Skin: warm and dry Neurologic:  Grossly normal  EKG NSR. 65 bpm. No ischemia.    ASSESSMENT AND PLAN:   1. H/o CP:  ETT (5/11): patient only able to exercise 2'30" due to fatigue. Lexiscan myoview (5/11) showed EF 57%, inferior perfusion defect suggestive of infarction with peri-infarction ischemia. Also could be artifact from significant activity in the gut. Lexiscan Cardiolite (9/14) with EF 50%, fixed inferior defect, no ischemia. EKG today shows NSR w/o ischemia. He denies any CP. No exertional symptoms. Continue medical therapy with ASA, statin and BB.   2. HTN: BP is well controlled on current regimen.   3. CKD: followed by [facility].   PLAN  Stable from a cardiac standpoint. Continue current plan of care. F/u with Dr. Aundra Dubin in 6 months.   Lyda Jester PA-C 08/28/2015 3:50 PM

## 2015-08-28 NOTE — Patient Instructions (Addendum)
Medication Instructions:  Your physician recommends that you continue on your current medications as directed. Please refer to the Current Medication list given to you today.  Labwork: NONE  Testing/Procedures: NONE  Follow-Up: Your physician recommends that you schedule a follow-up appointment in [place]  Any Other Special Instructions Will Be Listed Below (If Applicable).     If you need a refill on your cardiac medications before your next appointment, please call your pharmacy.

## 2015-08-30 NOTE — Addendum Note (Signed)
Addended by: Freada Bergeron on: 08/30/2015 12:12 PM   Modules accepted: Orders

## 2015-10-05 DIAGNOSIS — H40013 Open angle with borderline findings, low risk, bilateral: Secondary | ICD-10-CM | POA: Diagnosis not present

## 2015-10-05 DIAGNOSIS — H25813 Combined forms of age-related cataract, bilateral: Secondary | ICD-10-CM | POA: Diagnosis not present

## 2015-10-07 ENCOUNTER — Other Ambulatory Visit: Payer: Self-pay | Admitting: Family Medicine

## 2015-10-25 ENCOUNTER — Other Ambulatory Visit: Payer: Self-pay | Admitting: Family Medicine

## 2015-11-30 ENCOUNTER — Other Ambulatory Visit: Payer: Self-pay | Admitting: Family Medicine

## 2016-01-22 ENCOUNTER — Other Ambulatory Visit: Payer: Self-pay | Admitting: Family Medicine

## 2016-02-14 ENCOUNTER — Encounter: Payer: Self-pay | Admitting: Cardiology

## 2016-02-28 ENCOUNTER — Ambulatory Visit: Payer: Medicare Other | Admitting: Cardiology

## 2016-03-18 DIAGNOSIS — D509 Iron deficiency anemia, unspecified: Secondary | ICD-10-CM | POA: Diagnosis not present

## 2016-03-18 DIAGNOSIS — Z23 Encounter for immunization: Secondary | ICD-10-CM | POA: Diagnosis not present

## 2016-03-18 DIAGNOSIS — N184 Chronic kidney disease, stage 4 (severe): Secondary | ICD-10-CM | POA: Diagnosis not present

## 2016-03-18 DIAGNOSIS — N2581 Secondary hyperparathyroidism of renal origin: Secondary | ICD-10-CM | POA: Diagnosis not present

## 2016-03-18 DIAGNOSIS — I129 Hypertensive chronic kidney disease with stage 1 through stage 4 chronic kidney disease, or unspecified chronic kidney disease: Secondary | ICD-10-CM | POA: Diagnosis not present

## 2016-03-18 DIAGNOSIS — Z7689 Persons encountering health services in other specified circumstances: Secondary | ICD-10-CM | POA: Diagnosis not present

## 2016-04-22 ENCOUNTER — Other Ambulatory Visit: Payer: Self-pay | Admitting: Internal Medicine

## 2016-04-23 NOTE — Telephone Encounter (Signed)
Patient has not been seen in clinic in almost 1 year. Please call patient and have him make an appointment. Prescription refilled.

## 2016-04-24 NOTE — Telephone Encounter (Signed)
Spoke with patients daughter, informed her refill is ready and scheduled patient for OV on 05/02/2016. Nat Christen, CMA

## 2016-04-29 NOTE — Progress Notes (Signed)
   [facility] Phone: [phone]   Date of Visit: 05/02/2016   HPI:  Patient is here for follow up. His last visit at this clinic was 03/2015 Has no concerns today  HTN: - compliant with Norvasc $RemoveBefo'10mg'cMvLRjkLPQc$  daily and Metoprolol $RemoveBefore'25mg'kEARtVxjuIFNl$  daily. Metoprolol was decreased to daily from BID by his specialist due to dizziness which has now resolved.  - denies chest pain, shortness of breath, LE swelling, orthopnea  History of Prostrate Cancer:  - was followed by urology. Patient was cleared and reports he does not need regular follow up with them  - denies issues with urination  - declines digital rectal exam today   History of Smoking: Patient reports he has "a couple of puffs a day" of his pipe. Also reports he "doesnt inhale". He has been smoking for about 47 years.   ROS: See HPI.  [place]:  PMH:  CAD: Lexiscan Cardiolite in 01/2013 EF 50% with fixed inferior defect, no ischemia. Followed by Dr. Aundra Dubin  HTN HLD Tobacco Use CKD3 with secondary hyperparathyroidism followed by [facility]  Adenocarcinoma, Prostate s/p Robotic Prostatectomy 2010 Allergic Rhinitis   Hx of Esophageal Ulcer   PHYSICAL EXAM: BP 130/90 (BP Location: Left Arm, Patient Position: Sitting, Cuff Size: Normal)   Pulse 79   Temp 97.9 F (36.6 C) (Oral)   Ht $R'5\' 9"'EJ$  (1.753 m)   Wt 172 lb 12.8 oz (78.4 kg)   SpO2 97%   BMI 25.52 kg/m  GEN: NAD CV: RRR, no murmurs, rubs, or gallops PULM: CTAB, normal effort ABD: Soft, nontender, nondistended, NABS, no organomegaly SKIN: No rash or cyanosis; warm and well-perfused EXTR: No lower extremity edema or calf tenderness PSYCH: Mood and affect euthymic, normal rate and volume of speech NEURO: Awake, alert, no focal deficits grossly, normal speech  ASSESSMENT/PLAN:  Health maintenance:  - declines Colonoscopy. Declines Cologuard  - declines Zostavax - declines Tetanus  - lipid panel: will call to see if he is able to come in for lab visit  fasting  - with his history, it does not seem that he meets the 30 pack year criteria to obtain low dose CT scan for screening of lung cancer.   HYPERTENSION, BENIGN SYSTEMIC BP at goal continue current regimen.   History of Prostrate Cancer:  - declines digital rectal exam today   Smiley Houseman, MD PGY [place]

## 2016-05-02 ENCOUNTER — Encounter: Payer: Self-pay | Admitting: Internal Medicine

## 2016-05-02 ENCOUNTER — Ambulatory Visit (INDEPENDENT_AMBULATORY_CARE_PROVIDER_SITE_OTHER): Payer: Medicare Other | Admitting: Internal Medicine

## 2016-05-02 VITALS — BP 130/90 | HR 79 | Temp 97.9°F | Ht 69.0 in | Wt 172.8 lb

## 2016-05-02 DIAGNOSIS — I10 Essential (primary) hypertension: Secondary | ICD-10-CM | POA: Diagnosis not present

## 2016-05-02 DIAGNOSIS — E785 Hyperlipidemia, unspecified: Secondary | ICD-10-CM

## 2016-05-02 DIAGNOSIS — F17200 Nicotine dependence, unspecified, uncomplicated: Secondary | ICD-10-CM | POA: Diagnosis not present

## 2016-05-02 LAB — COMPLETE METABOLIC PANEL WITH GFR
ALBUMIN: 4.1 g/dL (ref 3.6–5.1)
ALK PHOS: 141 U/L — AB (ref 40–115)
ALT: 8 U/L — AB (ref 9–46)
AST: 14 U/L (ref 10–35)
BUN: 17 mg/dL (ref 7–25)
CALCIUM: 9.4 mg/dL (ref 8.6–10.3)
CO2: 25 mmol/L (ref 20–31)
CREATININE: 1.51 mg/dL — AB (ref 0.70–1.18)
Chloride: 103 mmol/L (ref 98–110)
GFR, Est African American: 52 mL/min — ABNORMAL LOW (ref >=60)
GFR, Est Non African American: 45 mL/min — ABNORMAL LOW (ref >=60)
GLUCOSE: 84 mg/dL (ref 65–99)
Potassium: 4.8 mmol/L (ref 3.5–5.3)
SODIUM: 136 mmol/L (ref 135–146)
Total Bilirubin: 1.1 mg/dL (ref 0.2–1.2)
Total Protein: 7 g/dL (ref 6.1–8.1)

## 2016-05-02 NOTE — Patient Instructions (Addendum)
Thank you for coming. We will get lab work today. I ordered a CT scan to screen for lung cancer.

## 2016-05-03 ENCOUNTER — Telehealth: Payer: Self-pay | Admitting: Internal Medicine

## 2016-05-03 NOTE — Telephone Encounter (Signed)
Pt's daughter states Dr. Dallas Schimke wanted to know how many times a day pt smoked his pipe a day. Pt says he has a couple puffs a day and doesn't inhale. Please advise.Thanks! ep

## 2016-05-03 NOTE — Assessment & Plan Note (Signed)
BP at goal continue current regimen.

## 2016-05-03 NOTE — Telephone Encounter (Signed)
Will forward to MD to advise. Merdis Snodgrass,CMA  

## 2016-05-06 ENCOUNTER — Telehealth: Payer: Self-pay | Admitting: Internal Medicine

## 2016-05-06 DIAGNOSIS — E785 Hyperlipidemia, unspecified: Secondary | ICD-10-CM

## 2016-05-06 DIAGNOSIS — Z8546 Personal history of malignant neoplasm of prostate: Secondary | ICD-10-CM

## 2016-05-06 NOTE — Telephone Encounter (Signed)
Called patient's contact number. This is his daughter's number. Discussed that patient does not meet criteria for lung cancer screening because of the amount of tobacco that he smokes. I also offered screening for prostrate cancer with PSA (patient declined digital rectal exam in clinic) and also recommended repeat lipid panel as this was done about 1 year ago. Daughter understood and will ask patient about the PSA and lipid. She is to make a lab visit if patient is interested in getting this done. I will go ahead and order the tests per daughter's request.

## 2016-07-03 ENCOUNTER — Other Ambulatory Visit: Payer: Self-pay | Admitting: Family Medicine

## 2016-08-09 ENCOUNTER — Other Ambulatory Visit: Payer: Self-pay | Admitting: Internal Medicine

## 2016-09-22 ENCOUNTER — Other Ambulatory Visit: Payer: Self-pay | Admitting: Internal Medicine

## 2016-10-24 DIAGNOSIS — I129 Hypertensive chronic kidney disease with stage 1 through stage 4 chronic kidney disease, or unspecified chronic kidney disease: Secondary | ICD-10-CM | POA: Diagnosis not present

## 2016-10-24 DIAGNOSIS — N184 Chronic kidney disease, stage 4 (severe): Secondary | ICD-10-CM | POA: Diagnosis not present

## 2016-10-24 DIAGNOSIS — Z7689 Persons encountering health services in other specified circumstances: Secondary | ICD-10-CM | POA: Diagnosis not present

## 2016-10-24 DIAGNOSIS — R42 Dizziness and giddiness: Secondary | ICD-10-CM | POA: Diagnosis not present

## 2016-10-24 DIAGNOSIS — N2581 Secondary hyperparathyroidism of renal origin: Secondary | ICD-10-CM | POA: Diagnosis not present

## 2016-10-24 DIAGNOSIS — D631 Anemia in chronic kidney disease: Secondary | ICD-10-CM | POA: Diagnosis not present

## 2016-10-24 DIAGNOSIS — K029 Dental caries, unspecified: Secondary | ICD-10-CM | POA: Diagnosis not present

## 2017-02-24 ENCOUNTER — Other Ambulatory Visit: Payer: Self-pay | Admitting: Internal Medicine

## 2017-02-26 ENCOUNTER — Encounter: Payer: Medicare Other | Admitting: Internal Medicine

## 2017-03-04 ENCOUNTER — Ambulatory Visit (INDEPENDENT_AMBULATORY_CARE_PROVIDER_SITE_OTHER): Payer: Medicare Other | Admitting: Internal Medicine

## 2017-03-04 ENCOUNTER — Encounter: Payer: Self-pay | Admitting: Internal Medicine

## 2017-03-04 VITALS — BP 130/80 | HR 70 | Temp 98.6°F | Ht 69.0 in | Wt 174.0 lb

## 2017-03-04 DIAGNOSIS — Z23 Encounter for immunization: Secondary | ICD-10-CM

## 2017-03-04 DIAGNOSIS — E785 Hyperlipidemia, unspecified: Secondary | ICD-10-CM | POA: Diagnosis not present

## 2017-03-04 DIAGNOSIS — Z0000 Encounter for general adult medical examination without abnormal findings: Secondary | ICD-10-CM

## 2017-03-04 DIAGNOSIS — Z8546 Personal history of malignant neoplasm of prostate: Secondary | ICD-10-CM

## 2017-03-04 NOTE — Patient Instructions (Addendum)
Thank you for coming Please look over the advanced directives information I ordered a cholesterol test. Please make a lab visit at your convenience and come in fasting (no food and drink other than water 8 hours prior).  I will also get a PSA.

## 2017-03-04 NOTE — Progress Notes (Deleted)
   [facility]           Phone: [phone]  Date of Visit: 03/04/2017   HPI:  Patient presents today for a well adult male exam.   Concerns today: *** Sexual activity: *** STD Screening: *** Exercise:  Diet: *** Smoking: *** Alcohol: *** Drugs: *** Advance directives: *** Mood: ***  ROS: See HPI  [place]:  PMH: HTN Allergic Rhinitis CKD 3 with secondary hyperparathyroidisim Tobacco Use Hx Prostrate Cancer   Cancers in family: ***  PHYSICAL EXAM: BP 130/80   Pulse 70   Temp 98.6 F (37 C) (Oral)   Ht $R'5\' 9"'aU$  (1.753 m)   Wt 174 lb (78.9 kg)   SpO2 98%   BMI 25.70 kg/m  Gen: NAD, pleasant, cooperative HEENT: NCAT, PERRL, no palpable thyromegaly or anterior cervical lymphadenopathy Heart: RRR, no murmurs Lungs: CTAB, NWOB Abdomen: soft, nontender to palpation Neuro: grossly nonfocal, speech normal GU: ***  ASSESSMENT/PLAN:  # Health maintenance:  -STD screening: *** -immunizations -lipid screening: *** -colonoscopy: *** -advance directives: *** -handout given on health maintenance topics  FOLLOW UP: Follow up in *** for ***  Smiley Houseman, MD PGY [place]

## 2017-03-04 NOTE — Progress Notes (Signed)
   [facility]               Phone: [phone]  Date of Visit: 03/04/2017   HPI:  Patient presents today for a well adult male exam.   Concerns today: None  Sexual activity: None reported  STD Screening: Not indicated  Exercise: Walks 15 minutes per day  Diet: Seldomly eats fast food or drinks soft drinks, low salt diet Smoking: Smokes a pipe daily, no cigarettes. Not interested in quitting Alcohol: None  Drugs: None  Advance directives: Not on file.  Mood: No depressed mood, sadness, anxiety or issues with stress   ROS:  Review of Systems  Constitutional: Negative for chills, fever and weight loss.  Eyes: Positive for blurred vision (wears glasses).  Respiratory: Negative for cough and shortness of breath.   Cardiovascular: Negative for chest pain, palpitations and leg swelling.  Gastrointestinal: Negative for abdominal pain, constipation, diarrhea, nausea and vomiting.  Genitourinary: Negative for dysuria, frequency and urgency.  Musculoskeletal: Negative for joint pain and myalgias.  Neurological: Negative for dizziness, tingling and focal weakness.  Psychiatric/Behavioral: Negative for depression and suicidal ideas. The patient is not nervous/anxious.      [place]:  Has a personal history of Prostate cancer   PHYSICAL EXAM: BP 130/80   Pulse 70   Temp 98.6 F (37 C) (Oral)   Ht $R'5\' 9"'BO$  (1.753 m)   Wt 174 lb (78.9 kg)   SpO2 98%   BMI 25.70 kg/m  Gen: NAD, pleasant, cooperative HEENT: NCAT, PERRL, no palpable thyromegaly or anterior cervical lymphadenopathy Heart: RRR, no murmurs Lungs: CTAB, NWOB Abdomen: soft, nontender to palpation Neuro: grossly nonfocal, speech normal  ASSESSMENT/PLAN:  # Health maintenance:  - immunizations: declined TDAP. Flu shot today  - lipid screening: made a future order for fasting lab  - colonoscopy: patient declines (including noninvasive screening for colon cancer)  - advance directives: not on file,  handout given at clinic today  - Annual PSA screening given personal history of prostate cancer. Discussed with patient about risks and benefits and patient would like to proceed.   FOLLOW UP: Follow up for annual visit   Cyndie Mull, Medical Student  ________________________________________________________________________ I agree with the medical student's note above and have made appropriate changes to assessment and plan.   Onnie Boer, MD Family Medicine, PGY 3 03/04/2017 5:05 PM

## 2017-03-04 NOTE — Addendum Note (Signed)
Addended by: Francene Castle on: 03/04/2017 05:37 PM   Modules accepted: Orders

## 2017-03-05 LAB — LIPID PANEL
CHOL/HDL RATIO: 2.9 ratio (ref 0.0–5.0)
Cholesterol, Total: 179 mg/dL (ref 100–199)
HDL: 62 mg/dL (ref >39)
LDL Calculated: 91 mg/dL (ref 0–99)
TRIGLYCERIDES: 132 mg/dL (ref 0–149)
VLDL Cholesterol Cal: 26 mg/dL (ref 5–40)

## 2017-03-05 LAB — PSA: Prostate Specific Ag, Serum: <0.1 ng/mL (ref 0.0–4.0)

## 2017-03-06 ENCOUNTER — Encounter: Payer: Self-pay | Admitting: Internal Medicine

## 2017-04-16 ENCOUNTER — Other Ambulatory Visit: Payer: Self-pay | Admitting: Internal Medicine

## 2017-06-10 DIAGNOSIS — N2581 Secondary hyperparathyroidism of renal origin: Secondary | ICD-10-CM | POA: Diagnosis not present

## 2017-06-10 DIAGNOSIS — R42 Dizziness and giddiness: Secondary | ICD-10-CM | POA: Diagnosis not present

## 2017-06-10 DIAGNOSIS — N184 Chronic kidney disease, stage 4 (severe): Secondary | ICD-10-CM | POA: Diagnosis not present

## 2017-06-10 DIAGNOSIS — Z7689 Persons encountering health services in other specified circumstances: Secondary | ICD-10-CM | POA: Diagnosis not present

## 2017-06-10 DIAGNOSIS — I129 Hypertensive chronic kidney disease with stage 1 through stage 4 chronic kidney disease, or unspecified chronic kidney disease: Secondary | ICD-10-CM | POA: Diagnosis not present

## 2017-06-10 DIAGNOSIS — D631 Anemia in chronic kidney disease: Secondary | ICD-10-CM | POA: Diagnosis not present

## 2017-07-04 ENCOUNTER — Other Ambulatory Visit: Payer: Self-pay | Admitting: Internal Medicine

## 2017-10-23 ENCOUNTER — Other Ambulatory Visit: Payer: Self-pay | Admitting: *Deleted

## 2017-10-23 MED ORDER — AMLODIPINE BESYLATE 10 MG PO TABS: 10.0000 mg | ORAL_TABLET | Freq: Every day | ORAL | 2 refills | Status: DC

## 2017-10-23 MED ORDER — OMEPRAZOLE 20 MG PO CPDR: 20.0000 mg | DELAYED_RELEASE_CAPSULE | Freq: Every day | ORAL | 2 refills | Status: DC

## 2017-10-27 ENCOUNTER — Other Ambulatory Visit: Payer: Self-pay

## 2017-10-27 MED ORDER — METOPROLOL TARTRATE 25 MG PO TABS: 25.0000 mg | ORAL_TABLET | Freq: Two times a day (BID) | ORAL | 2 refills | Status: DC

## 2017-10-27 MED ORDER — PRAVASTATIN SODIUM 40 MG PO TABS: 40.0000 mg | ORAL_TABLET | Freq: Every day | ORAL | 2 refills | Status: DC

## 2017-11-11 ENCOUNTER — Other Ambulatory Visit: Payer: Self-pay | Admitting: Internal Medicine

## 2018-01-04 ENCOUNTER — Other Ambulatory Visit: Payer: Self-pay | Admitting: Internal Medicine

## 2018-01-20 ENCOUNTER — Other Ambulatory Visit: Payer: Self-pay

## 2018-01-20 ENCOUNTER — Ambulatory Visit (INDEPENDENT_AMBULATORY_CARE_PROVIDER_SITE_OTHER): Payer: Medicare Other | Admitting: Family Medicine

## 2018-01-20 VITALS — BP 132/80 | HR 78 | Temp 98.3°F | Ht 69.0 in | Wt 179.0 lb

## 2018-01-20 DIAGNOSIS — Z23 Encounter for immunization: Secondary | ICD-10-CM

## 2018-01-20 DIAGNOSIS — I739 Peripheral vascular disease, unspecified: Secondary | ICD-10-CM

## 2018-01-20 NOTE — Progress Notes (Signed)
   CC: Bilateral calf pain  HPI  Bilateral leg pain- walks 1.5 blocks to [employer], now he has to have two stops. Gets better when stops. 5-6 months. Feels like tingling. No known hx of back problems. In calves and lower thighs. No meds for it. Occasional aleeve at night for pain. Smokes a pipe multiple times per day.  Does not desire to stop smoking at this point.  Has not taken anything because the pain does not persist beyond walking.  Daughter does finances and groceries.  They do not live together.  ROS: Denies CP, SOB, abdominal pain, dysuria, changes in BMs.   CC, SH/smoking status, and VS noted  Objective: BP 132/80   Pulse 78   Temp 98.3 F (36.8 C) (Oral)   Ht $R'5\' 9"'hW$  (1.753 m)   Wt 179 lb (81.2 kg)   SpO2 97%   BMI 26.43 kg/m  Gen: NAD, alert, cooperative, and pleasant. HEENT: NCAT, EOMI, PERRL CV: RRR, no murmur Resp: CTAB, no wheezes, non-labored Abd: SNTND, BS present, no guarding or organomegaly Ext: No edema, warm. No calf tenderness to palpation, +DP pulses. Normal musculature of bilateral calves.  Neuro: Alert and oriented, Speech clear, No gross deficits  Assessment and plan:  Claudication of both lower extremities (HCC) History suggestive of intermittent claudication, this diagnosis is made more likely by the fact that he is a longtime smoker and has a history of CAD.  Counseled patient that this needs to be confirmed with ABIs.  He will follow-up with Dr. Valentina Lucks for this.  Additionally, may benefit from Pletal in the future.  May also benefit from structured exercise program if ABIs suggests PAD.  Already on aspirin and statin.   Orders Placed This Encounter  Procedures  . Flu Vaccine QUAD 36+ mos IM    No orders of the defined types were placed in this encounter.    Ralene Ok, MD, PGY3 01/21/2018 10:36 AM

## 2018-01-20 NOTE — Patient Instructions (Signed)
It was a pleasure to see you today! Thank you for choosing [facility] for your primary care. Matthew [name] was seen for leg pain.   Our plans for today were:  He likely has claudication or peripheral arterial disease. Come back to see Dr. Valentina Lucks to test this.   Best,  Dr. Lindell Noe   Intermittent Claudication Intermittent claudication is pain in your leg that occurs when you walk or exercise and goes away when you rest. The pain can occur in one or both legs. What are the causes? Intermittent claudication is caused by the buildup of plaque within the major arteries in the body (atherosclerosis). The plaque, which makes arteries stiff and narrow, prevents enough blood from reaching your leg muscles. The pain occurs when you walk or exercise because your muscles need more blood when you are moving and exercising. What increases the risk? Risk factors include:  A family history of atherosclerosis.  A personal history of stroke or heart disease.  Older age.  Being inactive or overweight.  Smoking cigarettes.  Having another health condition such as: ? Diabetes. ? High blood pressure. ? High cholesterol.  What are the signs or symptoms? Your hip or leg may:  Ache.  Cramp.  Feel tight.  Feel weak.  Feel heavy.  Over time, you may feel pain in your calf, thigh, or hip. How is this diagnosed? Your health care provider may diagnose intermittent claudication based on your symptoms and medical history. Your health care provider may also do tests to learn more about your condition. These may include:  Blood tests.  An ultrasound.  Imaging tests such as angiography, magnetic resonance angiography (MRA), and computed tomography angiography (CTA).  How is this treated? You may be treated for problems such as:  High blood pressure.  High cholesterol.  Diabetes.  Other treatments may include:  Lifestyle changes such as: ? Starting an exercise  program. ? Losing weight. ? Quitting smoking.  Medicines to help restore blood flow through your legs.  Blood vessel surgery (angioplasty) to restore blood flow if your intermittent claudication is caused by severe peripheral artery disease.  Follow these instructions at home:  Manage any other health conditions you have.  Eat a diet low in saturated fats and calories to maintain a healthy weight.  Quit smoking, if you smoke.  Take medicines only as directed by your health care provider.  If your health care provider recommended an exercise program for you, follow it as directed. Your exercise program may involve: ? Walking three or more times a week. ? Walking until you have certain symptoms of intermittent claudication. ? Resting until symptoms go away. ? Gradually increasing walking time to about 50 minutes a day. Contact a health care provider if: Your condition is not getting better or is getting worse. Get help right away if:  You have chest pain.  You have difficulty breathing.  You develop arm weakness.  You have trouble speaking.  Your face begins to droop. This information is not intended to replace advice given to you by your health care provider. Make sure you discuss any questions you have with your health care provider. Document Released: 03/15/2004 Document Revised: 10/19/2015 Document Reviewed: 08/19/2013 Elsevier Interactive Patient Education  2017 [employer].

## 2018-01-21 DIAGNOSIS — I739 Peripheral vascular disease, unspecified: Secondary | ICD-10-CM | POA: Insufficient documentation

## 2018-01-21 NOTE — Assessment & Plan Note (Signed)
History suggestive of intermittent claudication, this diagnosis is made more likely by the fact that he is a longtime smoker and has a history of CAD.  Counseled patient that this needs to be confirmed with ABIs.  He will follow-up with Dr. Valentina Lucks for this.  Additionally, may benefit from Pletal in the future.  May also benefit from structured exercise program if ABIs suggests PAD.  Already on aspirin and statin.

## 2018-01-27 DIAGNOSIS — H25813 Combined forms of age-related cataract, bilateral: Secondary | ICD-10-CM | POA: Diagnosis not present

## 2018-01-27 DIAGNOSIS — H40013 Open angle with borderline findings, low risk, bilateral: Secondary | ICD-10-CM | POA: Diagnosis not present

## 2018-01-30 ENCOUNTER — Other Ambulatory Visit: Payer: Self-pay | Admitting: Internal Medicine

## 2018-02-02 ENCOUNTER — Encounter: Payer: Self-pay | Admitting: Pharmacist

## 2018-02-02 ENCOUNTER — Ambulatory Visit (INDEPENDENT_AMBULATORY_CARE_PROVIDER_SITE_OTHER): Payer: Medicare Other | Admitting: Pharmacist

## 2018-02-02 DIAGNOSIS — E7849 Other hyperlipidemia: Secondary | ICD-10-CM

## 2018-02-02 DIAGNOSIS — I739 Peripheral vascular disease, unspecified: Secondary | ICD-10-CM

## 2018-02-02 DIAGNOSIS — F17200 Nicotine dependence, unspecified, uncomplicated: Secondary | ICD-10-CM | POA: Diagnosis not present

## 2018-02-02 DIAGNOSIS — I10 Essential (primary) hypertension: Secondary | ICD-10-CM

## 2018-02-02 MED ORDER — METOPROLOL SUCCINATE ER 25 MG PO TB24: 25.0000 mg | ORAL_TABLET | Freq: Every day | ORAL | 3 refills | Status: DC

## 2018-02-02 MED ORDER — ATORVASTATIN CALCIUM 40 MG PO TABS: 40.0000 mg | ORAL_TABLET | Freq: Every day | ORAL | 3 refills | Status: DC

## 2018-02-02 NOTE — Assessment & Plan Note (Signed)
Moderate PAD based on ABI of 0.66 in a patient with symptoms of pain while walking minimal distances and tingly and cramping pain, and in patient with absent bilateral pedal pulses. - Discontinued script for pravastatin. - Started atorvastatin 40mg  once daily. - Continued aspirin 81mg . - Ordered lipid panel and CMET. - Repeat ABI in 1-3 months.

## 2018-02-02 NOTE — Progress Notes (Signed)
Patient ID: Matthew [name], male   DOB: July 08, 1940, 77 y.o.   MRN: [id number] Reviewed: Agree with Dr. Graylin Shiver documentation and management.

## 2018-02-02 NOTE — Patient Instructions (Addendum)
Thanks for coming in today!  Your exam showed reduced blood flow in your left leg, while your blood flow in your right leg looked normal.  1. START taking atorvastatin (Lipitor) 40mg . 2. START metoprolol succinate 25mg  once a day 3. Follow up with Dr. Grandville Silos in 4 weeks.

## 2018-02-02 NOTE — Assessment & Plan Note (Signed)
Medication Management - Changed from metoprolol tartrate to metoprolol succinate 25mg  once daily. - Counseled on how to take new statin and metoprolol. - Consider titration of metoprolol at next visit.

## 2018-02-02 NOTE — Progress Notes (Signed)
    S:    Patient arrives in good spirits accompanied by his daughter, walking without assistance. He presents to the clinic for PADABI evaluation.  Patient was referred by Primary Care Provider Dr. Lindell Noe  on 01/20/2018.   Last saw his PCP, Dr. Dallas Schimke 02/2017.     reports pain with walking.  Pain is described as tingly and cramping and occurs after walking about 2 blocks. Avoids walking for longer than across the street. Denies pain starting while at rest or standing still. Reports pain when walking at an ordinary pace on a level surface. Pain is localized to back of lower legs.  He has been a smoker since he was 77 years old. Has tried to quit, but has been able to reduce the amount he smokes. He had a previous history of smoking 1 pack a day, but now smokes a pack every two to three weeks. Also smokes from a pipe with morning cup of coffee.  Patient also reports nonadherence to pravastatin and metoprolol tartrate (takes the latter only once daily).  O:  Physical Exam  Constitutional: He appears well-developed and well-nourished.  Cardiovascular:  Pulses:      Dorsalis pedis pulses are Detected w/ doppler on the right side, and Detected w/ doppler on the left side.       Posterior tibial pulses are Detected w/ doppler on the right side, and 0 on the left side.  Skin: Skin is dry.  Vitals reviewed.  Review of Systems  Cardiovascular: Positive for claudication. Negative for leg swelling.   Lower extremity Physical Exam includes no palpable pedal pulses bilaterally, some pallor due to dryness, slight absence of hair growth around ankles.   ABI overall = 0.66. Right Arm 138 mmHg    Left Arm 142 mmHg Right ankle posterior tibial 150  mmHg     dorsalis pedis 122 mmHg Left ankle posterior tibial 068 mmHg    dorsalis pedis 094 mmHg   A/P: Moderate PAD based on ABI of 0.66 in a patient with symptoms of pain while walking minimal distances and tingly and cramping pain, and in patient  with absent bilateral pedal pulses. - Discontinued script for pravastatin. - Started atorvastatin 40mg  once daily. - Continued aspirin 81mg . - Ordered lipid panel and CMET. - Repeat ABI in 1-3 months.  Tobacco Cessation - Counseled on effects of smoking on PAD and health.  - Discussed quitting extensively. Patient does not seem interested at this time to fully commit, but agreed to try and smoke his pipe a little less.  Medication Management - Changed from metoprolol tartrate to metoprolol succinate 25mg  once daily. - Counseled on how to take new statin and metoprolol. - Consider titration of metoprolol at next visit.  Results reviewed and written information provided.   F/U Clinic Visit with Dr. Grandville Silos (PCP) in 4 weeks.  Total time in face-to-face counseling 40 minutes.  Patient seen with Isaias Sakai, PharmD, PGY1 Pharmacy Resident, and Sharyne Peach, PharmD Candidate.

## 2018-02-02 NOTE — Assessment & Plan Note (Signed)
Tobacco Cessation - Counseled on effects of smoking on PAD and health.  - Discussed quitting extensively. Patient does not seem interested at this time to fully commit, but agreed to try and smoke his pipe a little less.

## 2018-02-03 LAB — COMPREHENSIVE METABOLIC PANEL
ALK PHOS: 189 IU/L — AB (ref 39–117)
ALT: 7 IU/L (ref 0–44)
AST: 14 IU/L (ref 0–40)
Albumin/Globulin Ratio: 1.6 (ref 1.2–2.2)
Albumin: 4.5 g/dL (ref 3.5–4.8)
BILIRUBIN TOTAL: 1 mg/dL (ref 0.0–1.2)
BUN/Creatinine Ratio: 9 — ABNORMAL LOW (ref 10–24)
BUN: 16 mg/dL (ref 8–27)
CHLORIDE: 99 mmol/L (ref 96–106)
CO2: 23 mmol/L (ref 20–29)
Calcium: 9.8 mg/dL (ref 8.6–10.2)
Creatinine, Ser: 1.7 mg/dL — ABNORMAL HIGH (ref 0.76–1.27)
GFR calc Af Amer: 44 mL/min/{1.73_m2} — ABNORMAL LOW (ref >59)
GFR calc non Af Amer: 38 mL/min/{1.73_m2} — ABNORMAL LOW (ref >59)
GLUCOSE: 81 mg/dL (ref 65–99)
Globulin, Total: 2.9 g/dL (ref 1.5–4.5)
Potassium: 5.5 mmol/L — ABNORMAL HIGH (ref 3.5–5.2)
Sodium: 137 mmol/L (ref 134–144)
Total Protein: 7.4 g/dL (ref 6.0–8.5)

## 2018-02-03 LAB — LIPID PANEL
CHOLESTEROL TOTAL: 206 mg/dL — AB (ref 100–199)
Chol/HDL Ratio: 3 ratio (ref 0.0–5.0)
HDL: 69 mg/dL (ref >39)
LDL Calculated: 119 mg/dL — ABNORMAL HIGH (ref 0–99)
Triglycerides: 91 mg/dL (ref 0–149)
VLDL Cholesterol Cal: 18 mg/dL (ref 5–40)

## 2018-02-05 ENCOUNTER — Telehealth: Payer: Self-pay

## 2018-02-05 DIAGNOSIS — N189 Chronic kidney disease, unspecified: Secondary | ICD-10-CM | POA: Diagnosis not present

## 2018-02-05 DIAGNOSIS — E785 Hyperlipidemia, unspecified: Secondary | ICD-10-CM | POA: Diagnosis not present

## 2018-02-05 DIAGNOSIS — N184 Chronic kidney disease, stage 4 (severe): Secondary | ICD-10-CM | POA: Diagnosis not present

## 2018-02-05 DIAGNOSIS — E875 Hyperkalemia: Secondary | ICD-10-CM | POA: Diagnosis not present

## 2018-02-05 DIAGNOSIS — N2581 Secondary hyperparathyroidism of renal origin: Secondary | ICD-10-CM | POA: Diagnosis not present

## 2018-02-05 DIAGNOSIS — D631 Anemia in chronic kidney disease: Secondary | ICD-10-CM | POA: Diagnosis not present

## 2018-02-05 DIAGNOSIS — I129 Hypertensive chronic kidney disease with stage 1 through stage 4 chronic kidney disease, or unspecified chronic kidney disease: Secondary | ICD-10-CM | POA: Diagnosis not present

## 2018-02-05 NOTE — Telephone Encounter (Signed)
lab follow up phone call placed to daughter, Volney American, who is POA for patient. Discussed hyperkalemia and scheduled follow up on 02/23/18 with Dr. Grandville Silos for further management. Counseled on signs of hyperkalemia such as nausea, vomiting and heart palpations and discussed seeking care patient experiences symptoms. All questions were answered.   Isaias Sakai, Sherian Rein D PGY1 Pharmacy Resident  02/05/2018      9:52 AM

## 2018-02-23 ENCOUNTER — Ambulatory Visit: Payer: Medicare Other | Admitting: Family Medicine

## 2018-04-13 ENCOUNTER — Encounter: Payer: Self-pay | Admitting: Family Medicine

## 2018-04-13 ENCOUNTER — Ambulatory Visit (INDEPENDENT_AMBULATORY_CARE_PROVIDER_SITE_OTHER): Payer: Medicare Other | Admitting: Family Medicine

## 2018-04-13 ENCOUNTER — Other Ambulatory Visit: Payer: Self-pay

## 2018-04-13 VITALS — BP 130/70 | HR 74 | Temp 98.4°F | Ht 70.0 in | Wt 180.0 lb

## 2018-04-13 DIAGNOSIS — I10 Essential (primary) hypertension: Secondary | ICD-10-CM

## 2018-04-13 DIAGNOSIS — K029 Dental caries, unspecified: Secondary | ICD-10-CM | POA: Insufficient documentation

## 2018-04-13 DIAGNOSIS — Z23 Encounter for immunization: Secondary | ICD-10-CM | POA: Insufficient documentation

## 2018-04-13 DIAGNOSIS — Z8546 Personal history of malignant neoplasm of prostate: Secondary | ICD-10-CM

## 2018-04-13 DIAGNOSIS — N183 Chronic kidney disease, stage 3 unspecified: Secondary | ICD-10-CM

## 2018-04-13 DIAGNOSIS — Z0001 Encounter for general adult medical examination with abnormal findings: Secondary | ICD-10-CM

## 2018-04-13 DIAGNOSIS — F17200 Nicotine dependence, unspecified, uncomplicated: Secondary | ICD-10-CM

## 2018-04-13 HISTORY — DX: Personal history of malignant neoplasm of prostate: Z85.46

## 2018-04-13 HISTORY — DX: Dental caries, unspecified: K02.9

## 2018-04-13 MED ORDER — TETANUS-DIPHTH-ACELL PERTUSSIS 5-2-15.5 LF-MCG/0.5 IM SUSP: 0.5000 mL | Freq: Once | INTRAMUSCULAR | 0 refills | Status: DC

## 2018-04-13 MED ORDER — TETANUS-DIPHTH-ACELL PERTUSSIS 5-2-15.5 LF-MCG/0.5 IM SUSP: 0.5000 mL | Freq: Once | INTRAMUSCULAR | 0 refills | Status: AC

## 2018-04-13 NOTE — Progress Notes (Signed)
Established Patient Office Visit  Subjective:  Patient ID: Matthew [name], male    DOB: 1940-07-09  Age: 77 y.o. MRN: [id number]  CC:  Chief Complaint  Patient presents with  . Annual Exam    HPI Matthew [name] presents for annual physical.  Smoking history.  Patient smokes pipe daily.  Has been smoking for greater than 40 years.  Not interested in quitting smoking at this time.  Patient to have advanced care directive paperwork at home, waiting to fill out.  They did request more information, will send letter regarding information can find this on the [facility] state website.  Patient has falls assessment and Medicare check at home via [facility].  Patient claudication is well controlled with metoprolol, denies any lower extremity pain when walking.  Patient has no anginal symptoms either.  History of prostate cancer, no signs or symptoms of recurrence including lower back pain, loss of weight unintentionally, urinary symptoms.  SUBJECTIVE:   Current Outpatient Medications  Medication Sig Dispense Refill  . amLODipine (NORVASC) 10 MG tablet Take 1 tablet (10 mg total) by mouth daily. 90 tablet 2  . aspirin 81 MG tablet Take 81 mg by mouth daily.      [name] Kitchen atorvastatin (LIPITOR) 40 MG tablet Take 1 tablet (40 mg total) by mouth daily. 90 tablet 3  . metoprolol succinate (TOPROL-XL) 25 MG 24 hr tablet Take 1 tablet (25 mg total) by mouth daily. 90 tablet 3  . omeprazole (PRILOSEC) 20 MG capsule Take 1 capsule (20 mg total) by mouth daily. 90 capsule 2  . Tdap (ADACEL) 09-25-13.5 LF-MCG/0.5 injection Inject 0.5 mLs into the muscle once for 1 dose. 0.5 mL 0   No current facility-administered medications for this visit.    Allergies: Patient has no known allergies.   Review of Systems  Constitutional: Negative for chills and fever.  HENT: Negative for sore throat.   Eyes: Negative for blurred vision.  Respiratory: Negative for shortness of breath.   Cardiovascular: Negative for  chest pain.  Gastrointestinal: Negative for constipation and diarrhea.  All other systems reviewed and are negative.     Past Medical History:  Diagnosis Date  . ADENOCARCINOMA, PROSTATE 10/25/2008   Qualifier: Diagnosis of  By: Buelah Manis MD, Lonell Grandchild   S/p robotic prostatectomy in 2010   . ALLERGIC RHINITIS, SEASONAL 10/16/2009   Qualifier: Diagnosis of  By: Buelah Manis MD, Lonell Grandchild    . CHRONIC KIDNEY DISEASE STAGE 3 07/24/2006   Qualifier: Diagnosis of  By: Herma Ard   Followed by Dr. Louanna Raw at [facility], avoid nephrotoxic agents   . Coronary atherosclerosis of artery bypass graft 10/20/2009   Qualifier: Diagnosis of  By: Mack Guise    . Decreased hearing 01/15/2013  . ESOPHAGEAL ULCER, WITH BLEEDING 10/25/2008   Qualifier: Diagnosis of  By: Buelah Manis MD, Lonell Grandchild    . HYPERLIPIDEMIA 08/29/2009   Qualifier: Diagnosis of  By: Lynden Ang   Statin therapy   . HYPERTENSION, BENIGN SYSTEMIC 07/24/2006   Qualifier: Diagnosis of  By: Herma Ard    . SECONDARY HYPERPARATHYROIDISM 05/04/2007   Qualifier: Diagnosis of  By: Martinique, Bonnie    . TOBACCO ABUSE 08/10/2007   Qualifier: Diagnosis of  By: Girard Cooter MD, [place]    . Transfusion history 2010  . Unstable angina ([place]) 01/15/2013    Past Surgical History:  Procedure Laterality Date  . CORONARY ARTERY BYPASS GRAFT    . INGUINAL HERNIA REPAIR  2009  . PROSTATECTOMY  2010  robotic    Family History  Problem Relation Age of Onset  . Kidney disease Mother   . Hypertension Unknown   . Prostate cancer Unknown        grandfather  . Hypertension Sister        all 5  . Hypertension Brother     Social History   Socioeconomic History  . Marital status: Legally Separated    Spouse name: Not on file  . Number of children: Not on file  . Years of education: Not on file  . Highest education level: Not on file  Occupational History  . Not on file  Social Needs  . Financial resource strain: Not on file    . Food insecurity:    Worry: Not on file    Inability: Not on file  . Transportation needs:    Medical: Not on file    Non-medical: Not on file  Tobacco Use  . Smoking status: Current Every Day Smoker    Packs/day: 0.30    Years: 51.00    Pack years: 15.30    Types: Pipe    Start date: 05/1966  . Smokeless tobacco: Never Used  . Tobacco comment: Pipe smoker, 1 pack per day historically  Substance and Sexual Activity  . Alcohol use: Yes  . Drug use: No  . Sexual activity: Not on file  Lifestyle  . Physical activity:    Days per week: Not on file    Minutes per session: Not on file  . Stress: Not on file  Relationships  . Social connections:    Talks on phone: Not on file    Gets together: Not on file    Attends religious service: Not on file    Active member of club or organization: Not on file    Attends meetings of clubs or organizations: Not on file    Relationship status: Not on file  . Intimate partner violence:    Fear of current or ex partner: Not on file    Emotionally abused: Not on file    Physically abused: Not on file    Forced sexual activity: Not on file  Other Topics Concern  . Not on file  Social History Narrative  . Not on file    Outpatient Medications Prior to Visit  Medication Sig Dispense Refill  . amLODipine (NORVASC) 10 MG tablet Take 1 tablet (10 mg total) by mouth daily. 90 tablet 2  . aspirin 81 MG tablet Take 81 mg by mouth daily.      [name] Kitchen atorvastatin (LIPITOR) 40 MG tablet Take 1 tablet (40 mg total) by mouth daily. 90 tablet 3  . metoprolol succinate (TOPROL-XL) 25 MG 24 hr tablet Take 1 tablet (25 mg total) by mouth daily. 90 tablet 3  . omeprazole (PRILOSEC) 20 MG capsule Take 1 capsule (20 mg total) by mouth daily. 90 capsule 2   No facility-administered medications prior to visit.     No Known Allergies   Objective:    Physical Exam  Constitutional: He is oriented to person, place, and time. He appears well-developed and  well-nourished. No distress.  HENT:  Head: Normocephalic and atraumatic.  Mouth/Throat: Dental caries present.  Eyes: Conjunctivae are normal. Right eye exhibits discharge. Left eye exhibits no discharge.  Neck: No JVD present.  Cardiovascular: Normal rate, regular rhythm and normal heart sounds.  Pulmonary/Chest: Effort normal and breath sounds normal.  Abdominal: Soft. Bowel sounds are normal. He exhibits no distension. There is  no tenderness.  Musculoskeletal: He exhibits no edema.  Neurological: He is alert and oriented to person, place, and time.  Skin: Skin is warm and dry.  Psychiatric: He has a normal mood and affect. His behavior is normal. Thought content normal.    BP 130/70   Pulse 74   Temp 98.4 F (36.9 C) (Oral)   Ht $R'5\' 10"'Zk$  (1.778 m)   Wt 180 lb (81.6 kg)   SpO2 98%   BMI 25.83 kg/m  Wt Readings from Last 3 Encounters:  04/13/18 180 lb (81.6 kg)  02/02/18 165 lb 8 oz (75.1 kg)  01/20/18 179 lb (81.2 kg)     Health Maintenance Due  Topic Date Due  . TETANUS/TDAP  07/02/2014    There are no preventive care reminders to display for this patient.   Lab Results  Component Value Date   WBC 13.0 (H) 07/16/2009   HGB 15.1 07/16/2009   HCT 44.4 07/16/2009   MCV 96.4 07/16/2009   PLT 243 07/16/2009   Lab Results  Component Value Date   NA 137 02/02/2018   K 5.5 (H) 02/02/2018   CO2 23 02/02/2018   GLUCOSE 81 02/02/2018   BUN 16 02/02/2018   CREATININE 1.70 (H) 02/02/2018   BILITOT 1.0 02/02/2018   ALKPHOS 189 (H) 02/02/2018   AST 14 02/02/2018   ALT 7 02/02/2018   PROT 7.4 02/02/2018   ALBUMIN 4.5 02/02/2018   CALCIUM 9.8 02/02/2018   GFR 64.21 02/01/2013   Lab Results  Component Value Date   CHOL 206 (H) 02/02/2018   Lab Results  Component Value Date   HDL 69 02/02/2018   Lab Results  Component Value Date   LDLCALC 119 (H) 02/02/2018   Lab Results  Component Value Date   TRIG 91 02/02/2018   Lab Results  Component Value Date    CHOLHDL 3.0 02/02/2018   Lab Results  Component Value Date   HGBA1C  07/16/2009    5.8 (NOTE) The ADA recommends the following therapeutic goal for glycemic control related to Hgb A1c measurement: Goal of therapy: <6.5 Hgb A1c  Reference: American Diabetes Association: Clinical Practice Recommendations 2010, Diabetes Care, 2010, 33: (Suppl  1).      Assessment & Plan:   Problem List Items Addressed This Visit      Cardiovascular and Mediastinum   Essential hypertension    Well-controlled on current medication regimen.  Patient optimized with statin.      Relevant Orders   CBC     Digestive   Dental cavity    Patient with multiple dental caries.  No signs or symptoms of abscess right now.  Has been to see a dentist in the recent past.,  However he is unable to afford dental work.  Unsure if it is in alignment with his current insurance.  Will place referral with specific request that referral made be covered under patient's current insurance plan.      Relevant Orders   Ambulatory referral to Dentistry     Genitourinary   CHRONIC KIDNEY DISEASE STAGE 3    Stable on recent bmet.  Will screen for anemia.      Relevant Orders   CBC     Other   TOBACCO ABUSE    Counseled on risk of smoking.  Patient not interested in quitting at this moment.      History of prostate cancer    No signs or symptoms of recurrence.  Patient has planned follow-up with  urology.  Will draw PSA screen for recurrence.      Relevant Orders   PSA   Need for immunization using diphtheria-tetanus-pertussis with typhoid-paratyphoid (DTP + TAB) vaccine    Other Visit Diagnoses    Encounter for general adult medical examination with abnormal findings    -  Primary      Meds ordered this encounter  Medications  . DISCONTD: Tdap (ADACEL) 09-25-13.5 LF-MCG/0.5 injection    Sig: Inject 0.5 mLs into the muscle once for 1 dose.    Dispense:  0.5 mL    Refill:  0  . Tdap (ADACEL) 09-25-13.5 LF-MCG/0.5  injection    Sig: Inject 0.5 mLs into the muscle once for 1 dose.    Dispense:  0.5 mL    Refill:  0    Follow-up: Return in 6 months (on 10/12/2018).    Bonnita Hollow, MD

## 2018-04-13 NOTE — Assessment & Plan Note (Signed)
Patient with multiple dental caries.  No signs or symptoms of abscess right now.  Has been to see a dentist in the recent past.,  However he is unable to afford dental work.  Unsure if it is in alignment with his current insurance.  Will place referral with specific request that referral made be covered under patient's current insurance plan.

## 2018-04-13 NOTE — Assessment & Plan Note (Signed)
Counseled on risk of smoking.  Patient not interested in quitting at this moment.

## 2018-04-13 NOTE — Assessment & Plan Note (Signed)
No signs or symptoms of recurrence.  Patient has planned follow-up with urology.  Will draw PSA screen for recurrence.

## 2018-04-13 NOTE — Patient Instructions (Signed)

## 2018-04-13 NOTE — Assessment & Plan Note (Signed)
Stable on recent bmet.  Will screen for anemia.

## 2018-04-13 NOTE — Assessment & Plan Note (Signed)
Well-controlled on current medication regimen.  Patient optimized with statin.

## 2018-04-14 LAB — CBC
HEMATOCRIT: 42.2 % (ref 37.5–51.0)
Hemoglobin: 14.7 g/dL (ref 13.0–17.7)
MCH: 31.9 pg (ref 26.6–33.0)
MCHC: 34.8 g/dL (ref 31.5–35.7)
MCV: 92 fL (ref 79–97)
Platelets: 323 10*3/uL (ref 150–450)
RBC: 4.61 x10E6/uL (ref 4.14–5.80)
RDW: 13.4 % (ref 12.3–15.4)
WBC: 7 10*3/uL (ref 3.4–10.8)

## 2018-04-14 LAB — PSA

## 2018-04-16 ENCOUNTER — Encounter: Payer: Self-pay | Admitting: Family Medicine

## 2018-06-17 DIAGNOSIS — Z7689 Persons encountering health services in other specified circumstances: Secondary | ICD-10-CM | POA: Diagnosis not present

## 2018-06-17 DIAGNOSIS — E875 Hyperkalemia: Secondary | ICD-10-CM | POA: Diagnosis not present

## 2018-06-17 DIAGNOSIS — N184 Chronic kidney disease, stage 4 (severe): Secondary | ICD-10-CM | POA: Diagnosis not present

## 2018-06-17 DIAGNOSIS — D631 Anemia in chronic kidney disease: Secondary | ICD-10-CM | POA: Diagnosis not present

## 2018-06-17 DIAGNOSIS — N189 Chronic kidney disease, unspecified: Secondary | ICD-10-CM | POA: Diagnosis not present

## 2018-06-17 DIAGNOSIS — N2581 Secondary hyperparathyroidism of renal origin: Secondary | ICD-10-CM | POA: Diagnosis not present

## 2018-06-17 DIAGNOSIS — I129 Hypertensive chronic kidney disease with stage 1 through stage 4 chronic kidney disease, or unspecified chronic kidney disease: Secondary | ICD-10-CM | POA: Diagnosis not present

## 2018-07-20 ENCOUNTER — Ambulatory Visit (INDEPENDENT_AMBULATORY_CARE_PROVIDER_SITE_OTHER): Payer: Medicare Other

## 2018-07-20 VITALS — BP 126/82 | HR 83 | Temp 98.1°F | Ht 70.0 in | Wt 180.6 lb

## 2018-07-20 DIAGNOSIS — Z0000 Encounter for general adult medical examination without abnormal findings: Secondary | ICD-10-CM | POA: Diagnosis not present

## 2018-07-20 NOTE — Progress Notes (Signed)
Subjective:   Matthew [name] is a 78 y.o. male who presents for Medicare Annual/Subsequent preventive examination.  Review of Systems:  Physical assessment deferred to PCP.  Cardiac Risk Factors include: advanced age (>78men, >73 women);dyslipidemia;male gender;smoking/ tobacco exposure;hypertension     Objective:    Vitals: BP 126/82   Pulse 83   Temp 98.1 F (36.7 C) (Oral)   Ht $R'5\' 10"'Ob$  (1.778 m)   Wt 180 lb 9.6 oz (81.9 kg)   SpO2 98%   BMI 25.91 kg/m   Body mass index is 25.91 kg/m.  Advanced Directives 07/20/2018 04/13/2018 01/20/2018 03/04/2017 05/02/2016 04/03/2015 11/03/2014  Does Patient Have a Medical Advance Directive? No No Yes Yes Yes Yes No  Type of Advance Directive - Scientist, water quality of [place];Living will - [place];Living will -  Copy of [place] in Chart? - - No - copy requested No - copy requested - No - copy requested -  Would patient like information on creating a medical advance directive? No - Patient declined No - Patient declined No - Patient declined - - - No - patient declined information    Tobacco Social History   Tobacco Use  Smoking Status Current Every Day Smoker  . Packs/day: 0.30  . Years: 51.00  . Pack years: 15.30  . Types: Pipe  . Start date: 05/1966  Smokeless Tobacco Never Used  Tobacco Comment   Pipe smoker,      Ready to quit: No Counseling given: Yes Comment: Pipe smoker,    Clinical Intake:  Pre-visit preparation completed: Yes  Pain : No/denies pain Pain Score: 0-No pain     Nutritional Status: BMI 25 -29 Overweight Nutritional Risks: None Diabetes: No  What is the last grade level you completed in school?: 12th grade  Interpreter Needed?: No     Past Medical History:  Diagnosis Date  . ADENOCARCINOMA, PROSTATE 10/25/2008   Qualifier: Diagnosis of  By: Buelah Manis MD, Lonell Grandchild   S/p robotic prostatectomy in 2010   . ALLERGIC RHINITIS,  SEASONAL 10/16/2009   Qualifier: Diagnosis of  By: Buelah Manis MD, Lonell Grandchild    . CHRONIC KIDNEY DISEASE STAGE 3 07/24/2006   Qualifier: Diagnosis of  By: Herma Ard   Followed by Dr. Louanna Raw at [facility], avoid nephrotoxic agents   . Coronary atherosclerosis of artery bypass graft 10/20/2009   Qualifier: Diagnosis of  By: Mack Guise    . Decreased hearing 01/15/2013  . ESOPHAGEAL ULCER, WITH BLEEDING 10/25/2008   Qualifier: Diagnosis of  By: Buelah Manis MD, Lonell Grandchild    . HYPERLIPIDEMIA 08/29/2009   Qualifier: Diagnosis of  By: Lynden Ang   Statin therapy   . HYPERTENSION, BENIGN SYSTEMIC 07/24/2006   Qualifier: Diagnosis of  By: Herma Ard    . SECONDARY HYPERPARATHYROIDISM 05/04/2007   Qualifier: Diagnosis of  By: Martinique, Bonnie    . TOBACCO ABUSE 08/10/2007   Qualifier: Diagnosis of  By: Girard Cooter MD, [place]    . Transfusion history 2010  . Unstable angina ([place]) 01/15/2013   Past Surgical History:  Procedure Laterality Date  . CORONARY ARTERY BYPASS GRAFT    . INGUINAL HERNIA REPAIR  2009  . PROSTATECTOMY  2010   robotic   Family History  Problem Relation Age of Onset  . Kidney disease Mother   . Hypertension Brother   . Hypertension Other   . Prostate cancer Other        grandfather   Social  History   Socioeconomic History  . Marital status: Widowed    Spouse name: Not on file  . Number of children: 4  . Years of education: 65  . 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 Financial resource strain: Not hard at all  . Food insecurity:    Worry: Never true    Inability: Never true  . Transportation needs:    Medical: No    Non-medical: No  Tobacco Use  . Smoking status: Current Every Day Smoker    Packs/day: 0.30    Years: 51.00    Pack years: 15.30    Types: Pipe    Start date: 05/1966  . Smokeless tobacco: Never Used  . Tobacco comment: Pipe smoker,   Substance  and Sexual Activity  . Alcohol use: Not Currently  . Drug use: No  . Sexual activity: Not Currently  Lifestyle  . Physical activity:    Days per week: 7 days    Minutes per session: 30 min  . Stress: Not at all  Relationships  . Social connections:    Talks on phone: More than three times a week    Gets together: More than three times a week    Attends religious service: Never    Active member of club or organization: No    Attends meetings of clubs or organizations: Never    Relationship status: Widowed  Other Topics Concern  . Not on file  Social History Narrative   Lives by himself in a senior living apartment. Family is close by.   No pets.   Smoke alarms, grab bars in bathroom and string to pull for help.      No trouble with stairs.   Eats variety of foods, like to drink sweet tea.   Wears seat belt in car.       Retired Administrator. Likes crime shows.     Outpatient Encounter Medications as of 07/20/2018  Medication Sig  . amLODipine (NORVASC) 10 MG tablet Take 1 tablet (10 mg total) by mouth daily.  [name] Kitchen aspirin 81 MG tablet Take 81 mg by mouth daily.    [name] Kitchen atorvastatin (LIPITOR) 40 MG tablet Take 1 tablet (40 mg total) by mouth daily.  . metoprolol succinate (TOPROL-XL) 25 MG 24 hr tablet Take 1 tablet (25 mg total) by mouth daily.  [name] Kitchen omeprazole (PRILOSEC) 20 MG capsule Take 1 capsule (20 mg total) by mouth daily.   No facility-administered encounter medications on file as of 07/20/2018.     Activities of Daily Living In your present state of health, do you have any difficulty performing the following activities: 07/20/2018  Hearing? N  Vision? N  Comment wears glasses; up to date  Difficulty concentrating or making decisions? N  Walking or climbing stairs? N  Dressing or bathing? N  Doing errands, shopping? N  Preparing Food and eating ? N  Using the Toilet? N  In the past six months, have you accidently leaked urine? N  Do you have problems with loss of  bowel control? N  Managing your Medications? N  Managing your Finances? Y  Comment daughter Engineer, production or managing your Housekeeping? N  Some recent data might be hidden    Patient Care Team: Bonnita Hollow, MD as PCP - General (Family Medicine) Donato Heinz, MD as Consulting Physician (Nephrology) [facility] Larey Dresser, MD as Consulting  Physician (Cardiology)   Assessment:   This is a routine wellness examination for [place].  Exercise Activities and Dietary recommendations Current Exercise Habits: Home exercise routine, Type of exercise: walking, Time (Minutes): 30, Frequency (Times/Week): 7, Weekly Exercise (Minutes/Week): 210, Intensity: Mild, Exercise limited by: None identified  Goals    . Patient Stated     "keep up current health"       Fall Risk Fall Risk  07/20/2018 01/20/2018 03/04/2017 05/02/2016 04/03/2015  Falls in the past year? 0 No No No Yes  Number falls in past yr: - - - - 1  Injury with Fall? - - - - No   Is the patient's home free of loose throw rugs in walkways, pet beds, electrical cords, etc?   yes      Grab bars in the bathroom? yes      Handrails on the stairs?   yes      Adequate lighting?   yes   Depression Screen PHQ 2/9 Scores 07/20/2018 04/13/2018 01/20/2018 03/04/2017  PHQ - 2 Score 0 1 0 0    Cognitive Function MMSE - Mini Mental State Exam 07/20/2018  Orientation to time 5  Orientation to Place 5  Registration 3  Attention/ Calculation 5  Recall 3  Language- name 2 objects 2  Language- repeat 1  Language- follow 3 step command 3  Language- read & follow direction 1  Write a sentence 1  Copy design 1  Total score 30     6CIT Screen 07/20/2018  What Year? 0 points  What month? 0 points  What time? 0 points  Count back from 20 0 points  Months in reverse 0 points  Repeat phrase 0 points  Total Score 0    Immunization History  Administered Date(s) Administered  . Influenza Split  03/18/2011  . Influenza Whole 03/03/2008  . Influenza,inj,Quad PF,6+ Mos 04/26/2013, 04/03/2015, 03/04/2017, 01/20/2018  . Influenza-Unspecified 03/18/2016  . Pneumococcal Conjugate-13 04/03/2015  . Pneumococcal Polysaccharide-23 03/03/2008  . Td 07/02/2004      Screening Tests Health Maintenance  Topic Date Due  . TETANUS/TDAP  07/21/2019 (Originally 07/02/2014)  . INFLUENZA VACCINE  Completed  . PNA vac Low Risk Adult  Completed   Cancer Screenings: Lung: Low Dose CT Chest recommended if Age 46-80 years, 30 pack-year currently smoking OR have quit w/in 15years. Patient does qualify. Colorectal: aged out     Plan:  All health maintenance items up to date. No needs identified.   I have personally reviewed and noted the following in the patient's chart:   . Medical and social history . Use of alcohol, tobacco or illicit drugs  . Current medications and supplements . Functional ability and status . Nutritional status . Physical activity . Advanced directives . List of other physicians . Hospitalizations, surgeries, and ER visits in previous 12 months . Vitals . Screenings to include cognitive, depression, and falls . Referrals and appointments  In addition, I have reviewed and discussed with patient certain preventive protocols, quality metrics, and best practice recommendations. A written personalized care plan for preventive services as well as general preventive health recommendations were provided to patient.     Esau Grew, RN  07/20/2018

## 2018-07-20 NOTE — Patient Instructions (Addendum)
Matthew [name] , Thank you for taking time to come for your Medicare Wellness Visit. I appreciate your ongoing commitment to your health goals. Please review the following plan we discussed and let me know if I can assist you in the future.   These are the goals we discussed: Goals    . Patient Stated     "keep up current health"       This is a list of the screening recommended for you and due dates:  Health Maintenance  Topic Date Due  . Tetanus Vaccine  07/21/2019*  . Flu Shot  Completed  . Pneumonia vaccines  Completed  *Topic was postponed. The date shown is not the original due date.     Fall Prevention in the Home, Adult Falls can cause injuries. They can happen to people of all ages. There are many things you can do to make your home safe and to help prevent falls. Ask for help when making these changes, if needed. What actions can I take to prevent falls? General Instructions  Use good lighting in all rooms. Replace any light bulbs that burn out.  Turn on the lights when you go into a dark area. Use night-lights.  Keep items that you use often in easy-to-reach places. Lower the shelves around your home if necessary.  Set up your furniture so you have a clear path. Avoid moving your furniture around.  Do not have throw rugs and other things on the floor that can make you trip.  Avoid walking on wet floors.  If any of your floors are uneven, fix them.  Add color or contrast paint or tape to clearly mark and help you see: ? Any grab bars or handrails. ? First and last steps of stairways. ? Where the edge of each step is.  If you use a stepladder: ? Make sure that it is fully opened. Do not climb a closed stepladder. ? Make sure that both sides of the stepladder are locked into place. ? Ask someone to hold the stepladder for you while you use it.  If there are any pets around you, be aware of where they are. What can I do in the bathroom?      Keep the floor  dry. Clean up any water that spills onto the floor as soon as it happens.  Remove soap buildup in the tub or shower regularly.  Use non-skid mats or decals on the floor of the tub or shower.  Attach bath mats securely with double-sided, non-slip rug tape.  If you need to sit down in the shower, use a plastic, non-slip stool.  Install grab bars by the toilet and in the tub and shower. Do not use towel bars as grab bars. What can I do in the bedroom?  Make sure that you have a light by your bed that is easy to reach.  Do not use any sheets or blankets that are too big for your bed. They should not hang down onto the floor.  Have a firm chair that has side arms. You can use this for support while you get dressed. What can I do in the kitchen?  Clean up any spills right away.  If you need to reach something above you, use a strong step stool that has a grab bar.  Keep electrical cords out of the way.  Do not use floor polish or wax that makes floors slippery. If you must use wax, use non-skid floor wax.  What can I do with my stairs?  Do not leave any items on the stairs.  Make sure that you have a light switch at the top of the stairs and the bottom of the stairs. If you do not have them, ask someone to add them for you.  Make sure that there are handrails on both sides of the stairs, and use them. Fix handrails that are broken or loose. Make sure that handrails are as long as the stairways.  Install non-slip stair treads on all stairs in your home.  Avoid having throw rugs at the top or bottom of the stairs. If you do have throw rugs, attach them to the floor with carpet tape.  Choose a carpet that does not hide the edge of the steps on the stairway.  Check any carpeting to make sure that it is firmly attached to the stairs. Fix any carpet that is loose or worn. What can I do on the outside of my home?  Use bright outdoor lighting.  Regularly fix the edges of walkways and  driveways and fix any cracks.  Remove anything that might make you trip as you walk through a door, such as a raised step or threshold.  Trim any bushes or trees on the path to your home.  Regularly check to see if handrails are loose or broken. Make sure that both sides of any steps have handrails.  Install guardrails along the edges of any raised decks and porches.  Clear walking paths of anything that might make someone trip, such as tools or rocks.  Have any leaves, snow, or ice cleared regularly.  Use sand or salt on walking paths during winter.  Clean up any spills in your garage right away. This includes grease or oil spills. What other actions can I take?  Wear shoes that: ? Have a low heel. Do not wear high heels. ? Have rubber bottoms. ? Are comfortable and fit you well. ? Are closed at the toe. Do not wear open-toe sandals.  Use tools that help you move around (mobility aids) if they are needed. These include: ? Canes. ? Walkers. ? Scooters. ? Crutches.  Review your medicines with your doctor. Some medicines can make you feel dizzy. This can increase your chance of falling. Ask your doctor what other things you can do to help prevent falls. Where to find more information  Centers for Disease Control and Prevention, STEADI: https://garcia.biz/  [employer] on Aging: BrainJudge.co.uk Contact a doctor if:  You are afraid of falling at home.  You feel weak, drowsy, or dizzy at home.  You fall at home. Summary  There are many simple things that you can do to make your home safe and to help prevent falls.  Ways to make your home safe include removing tripping hazards and installing grab bars in the bathroom.  Ask for help when making these changes in your home. This information is not intended to replace advice given to you by your health care provider. Make sure you discuss any questions you have with your health care provider. Document  Released: 03/09/2009 Document Revised: 12/26/2016 Document Reviewed: 12/26/2016 Elsevier Interactive Patient Education  2019 [employer].

## 2018-07-21 NOTE — Progress Notes (Signed)
I agree with the findings listed in the note below.  Marny Lowenstein, MD, MS FAMILY MEDICINE RESIDENT - PGY2 07/21/2018 1:13 PM

## 2018-07-28 ENCOUNTER — Other Ambulatory Visit: Payer: Self-pay | Admitting: *Deleted

## 2018-07-28 MED ORDER — OMEPRAZOLE 20 MG PO CPDR: 20.0000 mg | DELAYED_RELEASE_CAPSULE | Freq: Every day | ORAL | 2 refills | Status: DC

## 2018-07-28 MED ORDER — AMLODIPINE BESYLATE 10 MG PO TABS: 10.0000 mg | ORAL_TABLET | Freq: Every day | ORAL | 2 refills | Status: DC

## 2018-11-23 ENCOUNTER — Other Ambulatory Visit: Payer: Self-pay | Admitting: Pharmacist

## 2018-11-23 DIAGNOSIS — I10 Essential (primary) hypertension: Secondary | ICD-10-CM

## 2018-11-23 DIAGNOSIS — E7849 Other hyperlipidemia: Secondary | ICD-10-CM

## 2019-01-25 DIAGNOSIS — E875 Hyperkalemia: Secondary | ICD-10-CM | POA: Diagnosis not present

## 2019-01-25 DIAGNOSIS — D631 Anemia in chronic kidney disease: Secondary | ICD-10-CM | POA: Diagnosis not present

## 2019-01-25 DIAGNOSIS — N2581 Secondary hyperparathyroidism of renal origin: Secondary | ICD-10-CM | POA: Diagnosis not present

## 2019-01-25 DIAGNOSIS — I129 Hypertensive chronic kidney disease with stage 1 through stage 4 chronic kidney disease, or unspecified chronic kidney disease: Secondary | ICD-10-CM | POA: Diagnosis not present

## 2019-01-25 DIAGNOSIS — N184 Chronic kidney disease, stage 4 (severe): Secondary | ICD-10-CM | POA: Diagnosis not present

## 2019-01-25 DIAGNOSIS — N189 Chronic kidney disease, unspecified: Secondary | ICD-10-CM | POA: Diagnosis not present

## 2019-02-23 ENCOUNTER — Ambulatory Visit (INDEPENDENT_AMBULATORY_CARE_PROVIDER_SITE_OTHER): Payer: Medicare Other | Admitting: *Deleted

## 2019-02-23 ENCOUNTER — Other Ambulatory Visit: Payer: Self-pay

## 2019-02-23 ENCOUNTER — Ambulatory Visit: Payer: Medicare Other

## 2019-02-23 DIAGNOSIS — H25813 Combined forms of age-related cataract, bilateral: Secondary | ICD-10-CM | POA: Diagnosis not present

## 2019-02-23 DIAGNOSIS — H43822 Vitreomacular adhesion, left eye: Secondary | ICD-10-CM | POA: Diagnosis not present

## 2019-02-23 DIAGNOSIS — H401132 Primary open-angle glaucoma, bilateral, moderate stage: Secondary | ICD-10-CM | POA: Diagnosis not present

## 2019-02-23 DIAGNOSIS — Z23 Encounter for immunization: Secondary | ICD-10-CM | POA: Diagnosis not present

## 2019-05-09 ENCOUNTER — Other Ambulatory Visit: Payer: Self-pay | Admitting: Family Medicine

## 2019-06-17 DIAGNOSIS — H401123 Primary open-angle glaucoma, left eye, severe stage: Secondary | ICD-10-CM | POA: Diagnosis not present

## 2019-06-17 DIAGNOSIS — H401112 Primary open-angle glaucoma, right eye, moderate stage: Secondary | ICD-10-CM | POA: Diagnosis not present

## 2019-07-18 ENCOUNTER — Ambulatory Visit: Payer: Medicare Other | Attending: Internal Medicine

## 2019-07-18 DIAGNOSIS — Z23 Encounter for immunization: Secondary | ICD-10-CM | POA: Insufficient documentation

## 2019-07-18 NOTE — Progress Notes (Signed)
   Covid-19 Vaccination Clinic  Name:  PADRAIC MARINOS    MRN: [id number] DOB: February 15, 1941  07/18/2019  Mr. Grahn was observed post Covid-19 immunization for 15 minutes without incidence. He was provided with Vaccine Information Sheet and instruction to access the V-Safe system.   Mr. Chancellor was instructed to call 911 with any severe reactions post vaccine: [name] Kitchen Difficulty breathing  . Swelling of your face and throat  . A fast heartbeat  . A bad rash all over your body  . Dizziness and weakness    Immunizations Administered    Name Date Dose VIS Date Route   Pfizer COVID-19 Vaccine 07/18/2019  1:53 PM 0.3 mL 05/07/2019 Intramuscular   Manufacturer: [place]   Lot: [id number]   [place]: [address]-4

## 2019-07-29 DIAGNOSIS — D509 Iron deficiency anemia, unspecified: Secondary | ICD-10-CM | POA: Diagnosis not present

## 2019-07-29 DIAGNOSIS — N184 Chronic kidney disease, stage 4 (severe): Secondary | ICD-10-CM | POA: Diagnosis not present

## 2019-07-29 DIAGNOSIS — N2581 Secondary hyperparathyroidism of renal origin: Secondary | ICD-10-CM | POA: Diagnosis not present

## 2019-07-29 DIAGNOSIS — I129 Hypertensive chronic kidney disease with stage 1 through stage 4 chronic kidney disease, or unspecified chronic kidney disease: Secondary | ICD-10-CM | POA: Diagnosis not present

## 2019-08-11 ENCOUNTER — Ambulatory Visit: Payer: Medicare Other | Attending: Internal Medicine

## 2019-08-11 DIAGNOSIS — Z23 Encounter for immunization: Secondary | ICD-10-CM

## 2019-08-11 NOTE — Progress Notes (Signed)
   Covid-19 Vaccination Clinic  Name:  Matthew [name]    MRN: [id number] DOB: [birth year]  08/11/2019  Matthew [name] was observed post Covid-19 immunization for 15 minutes without incident. He was provided with Vaccine Information Sheet and instruction to access the V-Safe system.   Matthew [name] was instructed to call 911 with any severe reactions post vaccine: [name] Kitchen Difficulty breathing  . Swelling of face and throat  . A fast heartbeat  . A bad rash all over body  . Dizziness and weakness   Immunizations Administered    Name Date Dose VIS Date Route   Pfizer COVID-19 Vaccine 08/11/2019  9:46 AM 0.3 mL 05/07/2019 Intramuscular   Manufacturer: [place]   Lot: [id number]   [place]: [address]-3

## 2019-10-15 DIAGNOSIS — H401112 Primary open-angle glaucoma, right eye, moderate stage: Secondary | ICD-10-CM | POA: Diagnosis not present

## 2019-10-15 DIAGNOSIS — H43813 Vitreous degeneration, bilateral: Secondary | ICD-10-CM | POA: Diagnosis not present

## 2019-10-15 DIAGNOSIS — H401123 Primary open-angle glaucoma, left eye, severe stage: Secondary | ICD-10-CM | POA: Diagnosis not present

## 2019-10-15 DIAGNOSIS — H43822 Vitreomacular adhesion, left eye: Secondary | ICD-10-CM | POA: Diagnosis not present

## 2019-10-15 DIAGNOSIS — H25813 Combined forms of age-related cataract, bilateral: Secondary | ICD-10-CM | POA: Diagnosis not present

## 2019-10-18 ENCOUNTER — Other Ambulatory Visit: Payer: Self-pay | Admitting: Family Medicine

## 2019-10-18 DIAGNOSIS — E7849 Other hyperlipidemia: Secondary | ICD-10-CM

## 2019-10-18 DIAGNOSIS — I10 Essential (primary) hypertension: Secondary | ICD-10-CM

## 2019-10-22 ENCOUNTER — Other Ambulatory Visit: Payer: Self-pay

## 2019-11-02 ENCOUNTER — Other Ambulatory Visit: Payer: Self-pay

## 2019-11-19 ENCOUNTER — Ambulatory Visit (INDEPENDENT_AMBULATORY_CARE_PROVIDER_SITE_OTHER): Payer: Medicare Other | Admitting: Family Medicine

## 2019-11-19 ENCOUNTER — Other Ambulatory Visit: Payer: Self-pay

## 2019-11-19 ENCOUNTER — Encounter: Payer: Self-pay | Admitting: Family Medicine

## 2019-11-19 VITALS — BP 110/62 | HR 76 | Ht 70.0 in | Wt 168.0 lb

## 2019-11-19 DIAGNOSIS — Z1159 Encounter for screening for other viral diseases: Secondary | ICD-10-CM | POA: Diagnosis not present

## 2019-11-19 DIAGNOSIS — I739 Peripheral vascular disease, unspecified: Secondary | ICD-10-CM

## 2019-11-19 DIAGNOSIS — E785 Hyperlipidemia, unspecified: Secondary | ICD-10-CM | POA: Diagnosis not present

## 2019-11-19 MED ORDER — CILOSTAZOL 50 MG PO TABS: 50.0000 mg | ORAL_TABLET | Freq: Two times a day (BID) | ORAL | 1 refills | Status: DC

## 2019-11-19 NOTE — Patient Instructions (Signed)
Peripheral Vascular Disease  Peripheral vascular disease (PVD) is a disease of the blood vessels that are not part of your heart and brain. A simple term for PVD is poor circulation. In most cases, PVD narrows the blood vessels that carry blood from your heart to the rest of your body. This can reduce the supply of blood to your arms, legs, and internal organs, like your stomach or kidneys. However, PVD most often affects a person's lower legs and feet. Without treatment, PVD tends to get worse. PVD can also lead to acute ischemic limb. This is when an arm or leg suddenly cannot get enough blood. This is a medical emergency. Follow these instructions at home: Lifestyle  Do not use any products that contain nicotine or tobacco, such as cigarettes and e-cigarettes. If you need help quitting, ask your doctor.  Lose weight if you are overweight. Or, stay at a healthy weight as told by your doctor.  Eat a diet that is low in fat and cholesterol. If you need help, ask your doctor.  Exercise regularly. Ask your doctor for activities that are right for you. General instructions  Take over-the-counter and prescription medicines only as told by your doctor.  Take good care of your feet: ? Wear comfortable shoes that fit well. ? Check your feet often for any cuts or sores.  Keep all follow-up visits as told by your doctor This is important. Contact a doctor if:  You have cramps in your legs when you walk.  You have leg pain when you are at rest.  You have coldness in a leg or foot.  Your skin changes.  You are unable to get or have an erection (erectile dysfunction).  You have cuts or sores on your feet that do not heal. Get help right away if:  Your arm or leg turns cold, numb, and blue.  Your arms or legs become red, warm, swollen, painful, or numb.  You have chest pain.  You have trouble breathing.  You suddenly have weakness in your face, arm, or leg.  You become very  confused or you cannot speak.  You suddenly have a very bad headache.  You suddenly cannot see. Summary  Peripheral vascular disease (PVD) is a disease of the blood vessels.  A simple term for PVD is poor circulation. Without treatment, PVD tends to get worse.  Treatment may include exercise, low fat and low cholesterol diet, and quitting smoking. This information is not intended to replace advice given to you by your health care provider. Make sure you discuss any questions you have with your health care provider. Document Revised: 04/25/2017 Document Reviewed: 06/20/2016 Elsevier Patient Education  2020 Elsevier Inc.  

## 2019-11-19 NOTE — Progress Notes (Signed)
    SUBJECTIVE:   CHIEF COMPLAINT / HPI:   Bilateral leg pain  PAD Patient with a history of claudication.  Says that this pain feels like claudication.  It is from the knees down to the feet.  It is worse with walking, better with rest. Patient tries to walk everyday, but only about 300 ft before he has to stop due to pain.  Patient has history of abnormal ABIs.  He is on metoprolol, aspirin, atorvastatin for stratification and treatment.  He has never seen a vascular surgeon.  Counseled on need to stop smoking in relation to heart and vascular disease.  Patient does not wanting to quit at this time did not want to discuss further options.  PERTINENT  PMH / PSH: Hypertension, CKD 3, tobacco abuse  OBJECTIVE:   BP 110/62   Pulse 76   Ht $R'5\' 10"'vo$  (1.778 m)   Wt 168 lb (76.2 kg)   SpO2 96%   BMI 24.11 kg/m   General: No acute distress MSK: Calves and feet warm to the touch, 1+ dorsalis pedis bilaterally, normal strength flexion extension, calves are nontender to palpation Skin: Bilateral shins and feet without ulcers  ASSESSMENT/PLAN:   PAD (peripheral artery disease) (HCC) PAD with claudication.  Is worsening.  Patient already taking Toprol and atorvastatin and aspirin.  We will add cilostazol for symptom improvement.  Encourage patient to increase walking.  Counseled on smoking cessation.  Will refer to vascular surgery for further evaluation work-up. -BMP -Lipid panel      Bonnita Hollow, MD [place]

## 2019-11-19 NOTE — Assessment & Plan Note (Addendum)
PAD with claudication.  Is worsening.  Patient already taking Toprol and atorvastatin and aspirin.  We will add cilostazol for symptom improvement.  Encourage patient to increase walking.  Counseled on smoking cessation.  Will refer to vascular surgery for further evaluation work-up. -BMP -Lipid panel

## 2019-11-20 LAB — HEPATITIS C ANTIBODY: Hep C Virus Ab: <0.1 s/co ratio (ref 0.0–0.9)

## 2019-11-20 LAB — BASIC METABOLIC PANEL
BUN/Creatinine Ratio: 13 (ref 10–24)
BUN: 17 mg/dL (ref 8–27)
CO2: 23 mmol/L (ref 20–29)
Calcium: 9.5 mg/dL (ref 8.6–10.2)
Chloride: 97 mmol/L (ref 96–106)
Creatinine, Ser: 1.36 mg/dL — ABNORMAL HIGH (ref 0.76–1.27)
GFR calc Af Amer: 57 mL/min/{1.73_m2} — ABNORMAL LOW (ref >59)
GFR calc non Af Amer: 49 mL/min/{1.73_m2} — ABNORMAL LOW (ref >59)
Glucose: 86 mg/dL (ref 65–99)
Potassium: 4.3 mmol/L (ref 3.5–5.2)
Sodium: 138 mmol/L (ref 134–144)

## 2019-11-20 LAB — LIPID PANEL
Chol/HDL Ratio: 2.2 ratio (ref 0.0–5.0)
Cholesterol, Total: 178 mg/dL (ref 100–199)
HDL: 80 mg/dL (ref >39)
LDL Chol Calc (NIH): 84 mg/dL (ref 0–99)
Triglycerides: 73 mg/dL (ref 0–149)
VLDL Cholesterol Cal: 14 mg/dL (ref 5–40)

## 2019-11-22 ENCOUNTER — Encounter: Payer: Self-pay | Admitting: Family Medicine

## 2020-01-20 ENCOUNTER — Other Ambulatory Visit: Payer: Self-pay

## 2020-01-20 DIAGNOSIS — I739 Peripheral vascular disease, unspecified: Secondary | ICD-10-CM

## 2020-01-28 ENCOUNTER — Other Ambulatory Visit: Payer: Self-pay

## 2020-01-28 ENCOUNTER — Ambulatory Visit (HOSPITAL_COMMUNITY)
Admission: RE | Admit: 2020-01-28 | Discharge: 2020-01-28 | Disposition: A | Discharge status: Home / Self Care | Payer: Medicare Other | Source: Ambulatory Visit | Attending: Vascular Surgery | Admitting: Vascular Surgery

## 2020-01-28 ENCOUNTER — Encounter: Payer: Self-pay | Admitting: Vascular Surgery

## 2020-01-28 ENCOUNTER — Ambulatory Visit (INDEPENDENT_AMBULATORY_CARE_PROVIDER_SITE_OTHER): Payer: Medicare Other | Admitting: Vascular Surgery

## 2020-01-28 VITALS — BP 134/95 | HR 94 | Temp 97.5°F | Resp 20 | Ht 70.0 in | Wt 166.0 lb

## 2020-01-28 DIAGNOSIS — I739 Peripheral vascular disease, unspecified: Secondary | ICD-10-CM | POA: Insufficient documentation

## 2020-01-28 NOTE — Progress Notes (Signed)
Patient ID: Matthew [name], male   DOB: 06/12/1940, 79 y.o.   MRN: [id number]  Reason for Consult: New Patient (Initial Visit)   Referred by Littie Deeds, MD  Subjective:     HPI:  Matthew [name] is a 79 y.o. male history of pipe smoking coronary artery disease status post bypass.  Denies any history of vascular disease.  He does have chronic kidney disease stage III diagnosed many years ago.  He is a retired [employer].  States that he has pain with walking approximately 50 yards.  This is relieved with rest.  He denies any tissue loss or ulceration.  Denies any history of stroke TIA or amaurosis.  Does not have any known history of abdominal aortic aneurysm.  Past Medical History:  Diagnosis Date  . 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 ALLERGIC RHINITIS, SEASONAL 10/16/2009   Qualifier: Diagnosis of  By: Jeanice Lim MD, Kingsley Spittle    . CHRONIC KIDNEY DISEASE STAGE 3 07/24/2006   Qualifier: Diagnosis of  By: Bebe Shaggy   Followed by Dr. Cherie Ouch at [facility], avoid nephrotoxic agents   . Coronary atherosclerosis of artery bypass graft 10/20/2009   Qualifier: Diagnosis of  By: Omar Person    . Decreased hearing 01/15/2013  . ESOPHAGEAL ULCER, WITH BLEEDING 10/25/2008   Qualifier: Diagnosis of  By: Jeanice Lim MD, Kingsley Spittle    . HYPERLIPIDEMIA 08/29/2009   Qualifier: Diagnosis of  By: Oswald Hillock   Statin therapy   . HYPERTENSION, BENIGN SYSTEMIC 07/24/2006   Qualifier: Diagnosis of  By: Bebe Shaggy    . SECONDARY HYPERPARATHYROIDISM 05/04/2007   Qualifier: Diagnosis of  By: [place], Bonnie    . TOBACCO ABUSE 08/10/2007   Qualifier: Diagnosis of  By: Corliss Marcus MD, MAKEECHA    . Transfusion history 2010  . Unstable angina (HCC) 01/15/2013   Family History  Problem Relation Age of Onset  . Kidney disease Mother   . Hypertension Brother   . Hypertension Other   . Prostate  cancer Other        grandfather   Past Surgical History:  Procedure Laterality Date  . CORONARY ARTERY BYPASS GRAFT    . INGUINAL HERNIA REPAIR  2009  . PROSTATECTOMY  2010   robotic    Short Social History:  Social History   Tobacco Use  . Smoking status: Current Every Day Smoker    Packs/day: 0.30    Years: 51.00    Pack years: 15.30    Types: Pipe    Start date: 05/1966  . Smokeless tobacco: Never Used  . Tobacco comment: Pipe smoker,   Substance Use Topics  . Alcohol use: Not Currently    No Known Allergies  Current Outpatient Medications  Medication Sig Dispense Refill  . amLODipine (NORVASC) 10 MG tablet TAKE 1 TABLET BY MOUTH  DAILY 90 tablet 3  . aspirin 81 MG tablet Take 81 mg by mouth daily.      [name] Kitchen atorvastatin (LIPITOR) 40 MG tablet TAKE 1 TABLET BY MOUTH  DAILY 90 tablet 3  . calcitRIOL (ROCALTROL) 0.25 MCG capsule Take by mouth.    . cilostazol (PLETAL) 50 MG tablet Take 1 tablet (50 mg total) by mouth 2 (two) times daily. 60 tablet 1  . metoprolol succinate (TOPROL-XL) 25 MG 24 hr tablet TAKE 1 TABLET BY MOUTH  DAILY 90 tablet 3  . omeprazole (PRILOSEC) 20 MG  capsule TAKE 1 CAPSULE BY MOUTH  DAILY 90 capsule 3   No current facility-administered medications for this visit.    Review of Systems  Constitutional:  Constitutional negative. HENT: HENT negative.  Eyes: Eyes negative.  Cardiovascular: Positive for claudication.  GI: Gastrointestinal negative.  Musculoskeletal: Musculoskeletal negative.  Skin: Skin negative.  Neurological: Neurological negative. Hematologic: Hematologic/lymphatic negative.  Psychiatric: Psychiatric negative.        Objective:  Objective   Vitals:   01/28/20 1125  BP: (!) 134/95  Pulse: 94  Resp: 20  Temp: (!) 97.5 F (36.4 C)  SpO2: 96%  Weight: 166 lb (75.3 kg)  Height: $Remove'5\' 10"'vzmxKIi$  (1.778 m)   Body mass index is 23.82 kg/m.  Physical Exam Constitutional:      Appearance: Normal appearance.  HENT:     Head:  Normocephalic.     Nose:     Comments: Wearing a mask Eyes:     Pupils: Pupils are equal, round, and reactive to light.  Cardiovascular:     Rate and Rhythm: Normal rate.     Pulses:          Femoral pulses are 2+ on the right side and 2+ on the left side.      Dorsalis pedis pulses are 1+ on the right side and 1+ on the left side.  Pulmonary:     Effort: Pulmonary effort is normal.     Breath sounds: Normal breath sounds.  Abdominal:     General: Abdomen is flat.     Palpations: Abdomen is soft. There is no mass.  Musculoskeletal:        General: No swelling.     Cervical back: Normal range of motion and neck supple.  Skin:    General: Skin is warm and dry.  Neurological:     General: No focal deficit present.     Mental Status: He is alert.  Psychiatric:        Mood and Affect: Mood normal.        Thought Content: Thought content normal.        Judgment: Judgment normal.     Data: ABI Findings:  +---------+------------------+-----+---------+--------+  Right  Rt Pressure (mmHg)IndexWaveform Comment   +---------+------------------+-----+---------+--------+  Brachial 136                      +---------+------------------+-----+---------+--------+  ATA   149        1.06 triphasic      +---------+------------------+-----+---------+--------+  PTA   156        1.11 biphasic       +---------+------------------+-----+---------+--------+  Great Toe89        0.64 Abnormal       +---------+------------------+-----+---------+--------+   +---------+------------------+-----+---------+-------+  Left   Lt Pressure (mmHg)IndexWaveform Comment  +---------+------------------+-----+---------+-------+  Brachial 140                      +---------+------------------+-----+---------+-------+  ATA   132        0.94 biphasic         +---------+------------------+-----+---------+-------+  PTA   123        0.88 triphasic      +---------+------------------+-----+---------+-------+  Great Toe105        0.75 Normal        +---------+------------------+-----+---------+-------+      Assessment/Plan:     79 year old male with symptoms consistent with claudication.  Does have weakly palpable dorsalis pedis pulses bilaterally.  Symptoms appear only with  ambulation.  We have discussed continued walking we will get him to follow-up in 6 months with exercise studies.  He will continue aspirin, statin, cilostazol.     Waynetta Sandy MD [facility]

## 2020-02-01 ENCOUNTER — Other Ambulatory Visit: Payer: Self-pay | Admitting: *Deleted

## 2020-02-01 DIAGNOSIS — I739 Peripheral vascular disease, unspecified: Secondary | ICD-10-CM

## 2020-02-03 DIAGNOSIS — E875 Hyperkalemia: Secondary | ICD-10-CM | POA: Diagnosis not present

## 2020-02-03 DIAGNOSIS — N1831 Chronic kidney disease, stage 3a: Secondary | ICD-10-CM | POA: Diagnosis not present

## 2020-02-03 DIAGNOSIS — D631 Anemia in chronic kidney disease: Secondary | ICD-10-CM | POA: Diagnosis not present

## 2020-02-03 DIAGNOSIS — I129 Hypertensive chronic kidney disease with stage 1 through stage 4 chronic kidney disease, or unspecified chronic kidney disease: Secondary | ICD-10-CM | POA: Diagnosis not present

## 2020-02-03 DIAGNOSIS — N189 Chronic kidney disease, unspecified: Secondary | ICD-10-CM | POA: Diagnosis not present

## 2020-02-18 DIAGNOSIS — H25813 Combined forms of age-related cataract, bilateral: Secondary | ICD-10-CM | POA: Diagnosis not present

## 2020-02-18 DIAGNOSIS — H401112 Primary open-angle glaucoma, right eye, moderate stage: Secondary | ICD-10-CM | POA: Diagnosis not present

## 2020-02-18 DIAGNOSIS — H401123 Primary open-angle glaucoma, left eye, severe stage: Secondary | ICD-10-CM | POA: Diagnosis not present

## 2020-02-18 DIAGNOSIS — H43822 Vitreomacular adhesion, left eye: Secondary | ICD-10-CM | POA: Diagnosis not present

## 2020-02-18 DIAGNOSIS — H43813 Vitreous degeneration, bilateral: Secondary | ICD-10-CM | POA: Diagnosis not present

## 2020-03-23 ENCOUNTER — Other Ambulatory Visit: Payer: Self-pay

## 2020-03-23 MED ORDER — AMLODIPINE BESYLATE 10 MG PO TABS
10.0000 mg | ORAL_TABLET | Freq: Every day | ORAL | 3 refills | Status: DC
Start: 2020-03-23 — End: 2021-02-23

## 2020-06-08 ENCOUNTER — Ambulatory Visit (INDEPENDENT_AMBULATORY_CARE_PROVIDER_SITE_OTHER): Payer: Medicare Other

## 2020-06-08 ENCOUNTER — Other Ambulatory Visit: Payer: Self-pay

## 2020-06-08 DIAGNOSIS — Z23 Encounter for immunization: Secondary | ICD-10-CM

## 2020-06-08 NOTE — Progress Notes (Signed)
Patient presents to nurse clinic for flu vaccination. Administered in LD, site unremarkable, tolerated injection well.   Sanyla Summey C Somya Jauregui, RN   

## 2020-06-23 DIAGNOSIS — H25813 Combined forms of age-related cataract, bilateral: Secondary | ICD-10-CM | POA: Diagnosis not present

## 2020-06-23 DIAGNOSIS — H401123 Primary open-angle glaucoma, left eye, severe stage: Secondary | ICD-10-CM | POA: Diagnosis not present

## 2020-06-23 DIAGNOSIS — H401112 Primary open-angle glaucoma, right eye, moderate stage: Secondary | ICD-10-CM | POA: Diagnosis not present

## 2020-06-23 DIAGNOSIS — H43813 Vitreous degeneration, bilateral: Secondary | ICD-10-CM | POA: Diagnosis not present

## 2020-06-23 DIAGNOSIS — H43822 Vitreomacular adhesion, left eye: Secondary | ICD-10-CM | POA: Diagnosis not present

## 2020-09-05 ENCOUNTER — Ambulatory Visit (INDEPENDENT_AMBULATORY_CARE_PROVIDER_SITE_OTHER): Payer: Medicare Other

## 2020-09-05 ENCOUNTER — Other Ambulatory Visit: Payer: Self-pay

## 2020-09-05 VITALS — BP 112/74 | HR 80 | Ht 69.0 in | Wt 153.0 lb

## 2020-09-05 DIAGNOSIS — Z23 Encounter for immunization: Secondary | ICD-10-CM

## 2020-09-05 DIAGNOSIS — Z0000 Encounter for general adult medical examination without abnormal findings: Secondary | ICD-10-CM

## 2020-09-05 NOTE — Progress Notes (Addendum)
Subjective:   Matthew [name] is a 80 y.o. male who presents for Medicare Annual/Subsequent preventive examination.  Review of Systems: Defer to PCP.   Cardiac Risk Factors include: advanced age (>47men, >41 women)  Objective:    Vitals: BP 112/74   Pulse 80   Ht $R'5\' 9"'pv$  (1.753 m)   Wt 153 lb (69.4 kg)   SpO2 98%   BMI 22.59 kg/m   Body mass index is 22.59 kg/m.  Advanced Directives 09/05/2020 11/19/2019 07/20/2018 04/13/2018 01/20/2018 03/04/2017 05/02/2016  Does Patient Have a Medical Advance Directive? Yes Yes No No Yes Yes Yes  Type of Paramedic of [place];Living will Living will - - Healthcare Power of [place];Living will -  Does patient want to make changes to medical advance directive? - Yes (ED - Information included in AVS) - - - - -  Copy of [place] in Chart? Yes - validated most recent copy scanned in chart (See row information) - - - No - copy requested No - copy requested -  Would patient like information on creating a medical advance directive? - - No - Patient declined No - Patient declined No - Patient declined - -   Tobacco Social History   Tobacco Use  Smoking Status Current Every Day Smoker  . Packs/day: 0.30  . Years: 51.00  . Pack years: 15.30  . Types: Pipe  . Start date: 05/1966  Smokeless Tobacco Never Used  Tobacco Comment   Pipe smoker,      Ready to quit: No Counseling given: Yes  Clinical Intake:  Pre-visit preparation completed: Yes  Pain Score: 0-No pain  How often do you need to have someone help you when you read instructions, pamphlets, or other written materials from your doctor or pharmacy?: 2 - Rarely What is the last grade level you completed in school?: high school  Interpreter Needed?: No  Past Medical History:  Diagnosis Date  . ADENOCARCINOMA, PROSTATE 10/25/2008   Qualifier: Diagnosis of  By: Buelah Manis MD, Lonell Grandchild   S/p robotic prostatectomy in 2010    . ALLERGIC RHINITIS, SEASONAL 10/16/2009   Qualifier: Diagnosis of  By: Buelah Manis MD, Lonell Grandchild    . CHRONIC KIDNEY DISEASE STAGE 3 07/24/2006   Qualifier: Diagnosis of  By: Herma Ard   Followed by Dr. Louanna Raw at [facility], avoid nephrotoxic agents   . Coronary atherosclerosis of artery bypass graft 10/20/2009   Qualifier: Diagnosis of  By: Mack Guise    . Decreased hearing 01/15/2013  . ESOPHAGEAL ULCER, WITH BLEEDING 10/25/2008   Qualifier: Diagnosis of  By: Buelah Manis MD, Lonell Grandchild    . HYPERLIPIDEMIA 08/29/2009   Qualifier: Diagnosis of  By: Lynden Ang   Statin therapy   . HYPERTENSION, BENIGN SYSTEMIC 07/24/2006   Qualifier: Diagnosis of  By: Herma Ard    . SECONDARY HYPERPARATHYROIDISM 05/04/2007   Qualifier: Diagnosis of  By: Martinique, Bonnie    . TOBACCO ABUSE 08/10/2007   Qualifier: Diagnosis of  By: Girard Cooter MD, [place]    . Transfusion history 2010  . Unstable angina ([place]) 01/15/2013   Past Surgical History:  Procedure Laterality Date  . CORONARY ARTERY BYPASS GRAFT    . INGUINAL HERNIA REPAIR  2009  . PROSTATECTOMY  2010   robotic   Family History  Problem Relation Age of Onset  . Kidney disease Mother   . Hypertension Brother   . Hypertension Other   . Prostate cancer Other  grandfather   Social History   Socioeconomic History  . Marital status: Widowed    Spouse name: Not on file  . Number of children: 5  . Years of education: 81  . Highest education level: 12th grade  Occupational History    Comment: truck driver - retired  Tobacco Use  . Smoking status: Current Every Day Smoker    Packs/day: 0.30    Years: 51.00    Pack years: 15.30    Types: Pipe    Start date: 05/1966  . Smokeless tobacco: Never Used  . Tobacco comment: Pipe smoker,   Vaping Use  . Vaping Use: Never used  Substance and Sexual Activity  . Alcohol use: Not Currently  . Drug use: No  . Sexual activity: Not Currently  Other Topics Concern  .  Not on file  Social History Narrative   Patient lives alone in [place].    Patient has 3 daughters and 2 sons.   Patient eats a variety of foods, likes to drink sweet tea.   Patient is a retired Administrator.   Patient enjoys watching crime shows.       Social Determinants of Health   Financial Resource Strain: Low Risk   . Difficulty of Paying Living Expenses: Not hard at all  Food Insecurity: No Food Insecurity  . Worried About Charity fundraiser in the Last Year: Never true  . Ran Out of Food in the Last Year: Never true  Transportation Needs: No Transportation Needs  . Lack of Transportation (Medical): No  . Lack of Transportation (Non-Medical): No  Physical Activity: Insufficiently Active  . Days of Exercise per Week: 7 days  . Minutes of Exercise per Session: 20 min  Stress: No Stress Concern Present  . Feeling of Stress : Not at all  Social Connections: Moderately Isolated  . Frequency of Communication with Friends and Family: More than three times a week  . Frequency of Social Gatherings with Friends and Family: More than three times a week  . Attends Religious Services: More than 4 times per year  . Active Member of Clubs or Organizations: No  . Attends Archivist Meetings: Never  . Marital Status: Widowed   Outpatient Encounter Medications as of 09/05/2020  Medication Sig  . amLODipine (NORVASC) 10 MG tablet Take 1 tablet (10 mg total) by mouth daily.  [name] Kitchen aspirin 81 MG tablet Take 81 mg by mouth daily.  [name] Kitchen atorvastatin (LIPITOR) 40 MG tablet TAKE 1 TABLET BY MOUTH  DAILY  . calcitRIOL (ROCALTROL) 0.25 MCG capsule Take by mouth. Takes Monday, Wednesday, and Friday  . latanoprost (XALATAN) 0.005 % ophthalmic solution Place 1 drop into both eyes at bedtime.  . metoprolol succinate (TOPROL-XL) 25 MG 24 hr tablet TAKE 1 TABLET BY MOUTH  DAILY  . omeprazole (PRILOSEC) 20 MG capsule TAKE 1 CAPSULE BY MOUTH  DAILY  . cilostazol (PLETAL) 50 MG tablet Take 1 tablet  (50 mg total) by mouth 2 (two) times daily. (Patient not taking: Reported on 09/05/2020)   No facility-administered encounter medications on file as of 09/05/2020.   Activities of Daily Living In your present state of health, do you have any difficulty performing the following activities: 09/05/2020  Hearing? N  Vision? N  Difficulty concentrating or making decisions? N  Walking or climbing stairs? Y  Dressing or bathing? N  Doing errands, shopping? N  Preparing Food and eating ? N  Using the Toilet? N  In the past  six months, have you accidently leaked urine? N  Do you have problems with loss of bowel control? N  Managing your Medications? N  Managing your Finances? N  Housekeeping or managing your Housekeeping? N  Some recent data might be hidden   Patient Care Team: Zola Button, MD as PCP - General (Family Medicine) Donato Heinz, MD as Consulting Physician (Nephrology) [facility] Larey Dresser, MD as Consulting Physician (Cardiology)    Assessment:   This is a routine wellness examination for [place].  Exercise Activities and Dietary recommendations Current Exercise Habits: Home exercise routine, Type of exercise: walking, Time (Minutes): 20, Exercise limited by: None identified  Goals    . DIET - INCREASE WATER INTAKE     Drinks sweet tea.     . Patient Stated     "keep up current health"      Fall Risk Fall Risk  09/05/2020 11/19/2019 07/20/2018 01/20/2018 03/04/2017  Falls in the past year? 0 0 0 No No  Number falls in past yr: - 0 - - -  Injury with Fall? - 0 - - -  Follow up Falls prevention discussed - - - -   Is the patient's home free of loose throw rugs in walkways, pet beds, electrical cords, etc?   yes      Grab bars in the bathroom? yes      Handrails on the stairs?   yes      Adequate lighting?   yes  Patient rating of health (0-10): 10  Depression Screen PHQ 2/9 Scores 09/05/2020 11/19/2019 07/20/2018 04/13/2018  PHQ - 2  Score 0 0 0 1   Cognitive Function MMSE - Mini Mental State Exam 07/20/2018  Orientation to time 5  Orientation to Place 5  Registration 3  Attention/ Calculation 5  Recall 3  Language- name 2 objects 2  Language- repeat 1  Language- follow 3 step command 3  Language- read & follow direction 1  Write a sentence 1  Copy design 1  Total score 30   6CIT Screen 09/05/2020 07/20/2018  What Year? 0 points 0 points  What month? 0 points 0 points  What time? 0 points 0 points  Count back from 20 0 points 0 points  Months in reverse 0 points 0 points  Repeat phrase 0 points 0 points  Total Score 0 0   Immunization History  Administered Date(s) Administered  . Fluad Quad(high Dose 65+) 06/08/2020  . Influenza Split 03/18/2011  . Influenza Whole 03/03/2008  . Influenza,inj,Quad PF,6+ Mos 04/26/2013, 04/03/2015, 03/04/2017, 01/20/2018, 02/23/2019  . Influenza-Unspecified 03/18/2016  . PFIZER(Purple Top)SARS-COV-2 Vaccination 07/18/2019, 08/11/2019, 03/10/2020  . Pneumococcal Conjugate-13 04/03/2015  . Pneumococcal Polysaccharide-23 03/03/2008  . Td 07/02/2004   Screening Tests Health Maintenance  Topic Date Due  . TETANUS/TDAP  07/02/2014  . INFLUENZA VACCINE  12/25/2020  . COVID-19 Vaccine  Completed  . Hepatitis C Screening  Completed  . PNA vac Low Risk Adult  Completed  . HPV VACCINES  Aged Out   Cancer Screenings: Lung: Low Dose CT Chest recommended if Age 23-80 years, 30 pack-year currently smoking OR have quit w/in 15years. Patient does qualify. Has been ordered in the past, however never completed.  Colorectal: UTD  Additional Screenings: Hepatitis C Screening: Completed   Plan:  2nd Covid Booster given today.  Call in May to schedule an apt with new PCP for June.  Increase water intake.  I have personally reviewed and noted the following in  the patient's chart:   . Medical and social history . Use of alcohol, tobacco or illicit drugs  . Current medications  and supplements . Functional ability and status . Nutritional status . Physical activity . Advanced directives . List of other physicians . Hospitalizations, surgeries, and ER visits in previous 12 months . Vitals . Screenings to include cognitive, depression, and falls . Referrals and appointments  In addition, I have reviewed and discussed with patient certain preventive protocols, quality metrics, and best practice recommendations. A written personalized care plan for preventive services as well as general preventive health recommendations were provided to patient.   Dorna Bloom, [place]  09/05/2020   I have reviewed this visit and agree with the documentation.   Zola Button, MD

## 2020-09-05 NOTE — Patient Instructions (Signed)
You spoke to Matthew [name], [place] for your annual wellness visit.  We discussed goals: Goals    . DIET - INCREASE WATER INTAKE     Drinks sweet tea.     . Patient Stated     "keep up current health"      We also discussed recommended health maintenance. As discussed, you are pretty much up to date with everything!   Health Maintenance  Topic Date Due  . TETANUS/TDAP  07/02/2014  . INFLUENZA VACCINE  12/25/2020  . COVID-19 Vaccine  Completed  . Hepatitis C Screening  Completed  . PNA vac Low Risk Adult  Completed  . HPV VACCINES  Aged Out   2nd Covid Booster given today.  Call in May to schedule an apt with new PCP for June.  Increase water intake.  We also discussed smoking cessation. 1-800-Quit-Now  Preventive Care 36 Years and Older, Male Preventive care refers to lifestyle choices and visits with your health care provider that can promote health and wellness. This includes:  A yearly physical exam. This is also called an annual wellness visit.  Regular dental and eye exams.  Immunizations.  Screening for certain conditions.  Healthy lifestyle choices, such as: ? Eating a healthy diet. ? Getting regular exercise. ? Not using drugs or products that contain nicotine and tobacco. ? Limiting alcohol use. What can I expect for my preventive care visit? Physical exam Your health care provider will check your:  Height and weight. These may be used to calculate your BMI (body mass index). BMI is a measurement that tells if you are at a healthy weight.  Heart rate and blood pressure.  Body temperature.  Skin for abnormal spots. Counseling Your health care provider may ask you questions about your:  Past medical problems.  Family's medical history.  Alcohol, tobacco, and drug use.  Emotional well-being.  Home life and relationship well-being.  Sexual activity.  Diet, exercise, and sleep habits.  History of falls.  Memory and ability to understand  (cognition).  Work and work Statistician.  Access to firearms. What immunizations do I need? Vaccines are usually given at various ages, according to a schedule. Your health care provider will recommend vaccines for you based on your age, medical history, and lifestyle or other factors, such as travel or where you work.   What tests do I need? Blood tests  Lipid and cholesterol levels. These may be checked every 5 years, or more often depending on your overall health.  Hepatitis C test.  Hepatitis B test. Screening  Lung cancer screening. You may have this screening every year starting at age 59 if you have a 30-pack-year history of smoking and currently smoke or have quit within the past 15 years.  Colorectal cancer screening. ? All adults should have this screening starting at age 56 and continuing until age 90. ? Your health care provider may recommend screening at age 53 if you are at increased risk. ? You will have tests every 1-10 years, depending on your results and the type of screening test.  Prostate cancer screening. Recommendations will vary depending on your family history and other risks.  Genital exam to check for testicular cancer or hernias.  Diabetes screening. ? This is done by checking your blood sugar (glucose) after you have not eaten for a while (fasting). ? You may have this done every 1-3 years.  Abdominal aortic aneurysm (AAA) screening. You may need this if you are a current or  former smoker.  STD (sexually transmitted disease) testing, if you are at risk. Follow these instructions at home: Eating and drinking  Eat a diet that includes fresh fruits and vegetables, whole grains, lean protein, and low-fat dairy products. Limit your intake of foods with high amounts of sugar, saturated fats, and salt.  Take vitamin and mineral supplements as recommended by your health care provider.  Do not drink alcohol if your health care provider tells you not to  drink.  If you drink alcohol: ? Limit how much you have to 0-2 drinks a day. ? Be aware of how much alcohol is in your drink. In the [place] one drink equals one 12 oz bottle of beer (355 mL), one 5 oz glass of wine (148 mL), or one 1 oz glass of hard liquor (44 mL).   Lifestyle  Take daily care of your teeth and gums. Brush your teeth every morning and night with fluoride toothpaste. Floss one time each day.  Stay active. Exercise for at least 30 minutes 5 or more days each week.  Do not use any products that contain nicotine or tobacco, such as cigarettes, e-cigarettes, and chewing tobacco. If you need help quitting, ask your health care provider.  Do not use drugs.  If you are sexually active, practice safe sex. Use a condom or other form of protection to prevent STIs (sexually transmitted infections).  Talk with your health care provider about taking a low-dose aspirin or statin.  Find healthy ways to cope with stress, such as: ? Meditation, yoga, or listening to music. ? Journaling. ? Talking to a trusted person. ? Spending time with friends and family. Safety  Always wear your seat belt while driving or riding in a vehicle.  Do not drive: ? If you have been drinking alcohol. Do not ride with someone who has been drinking. ? When you are tired or distracted. ? While texting.  Wear a helmet and other protective equipment during sports activities.  If you have firearms in your house, make sure you follow all gun safety procedures. What's next?  Visit your health care provider once a year for an annual wellness visit.  Ask your health care provider how often you should have your eyes and teeth checked.  Stay up to date on all vaccines. This information is not intended to replace advice given to you by your health care provider. Make sure you discuss any questions you have with your health care provider. Document Revised: 02/09/2019 Document Reviewed: 05/07/2018 Elsevier  Patient Education  2021 [place] Prevention in the Home, Adult Falls can cause injuries and can happen to people of all ages. There are many things you can do to make your home safe and to help prevent falls. Ask for help when making these changes. What actions can I take to prevent falls? General Instructions  Use good lighting in all rooms. Replace any light bulbs that burn out.  Turn on the lights in dark areas. Use night-lights.  Keep items that you use often in easy-to-reach places. Lower the shelves around your home if needed.  Set up your furniture so you have a clear path. Avoid moving your furniture around.  Do not have throw rugs or other things on the floor that can make you trip.  Avoid walking on wet floors.  If any of your floors are uneven, fix them.  Add color or contrast paint or tape to clearly mark and help you see: ? Grab  bars or handrails. ? First and last steps of staircases. ? Where the edge of each step is.  If you use a stepladder: ? Make sure that it is fully opened. Do not climb a closed stepladder. ? Make sure the sides of the stepladder are locked in place. ? Ask someone to hold the stepladder while you use it.  Know where your pets are when moving through your home. What can I do in the bathroom?  Keep the floor dry. Clean up any water on the floor right away.  Remove soap buildup in the tub or shower.  Use nonskid mats or decals on the floor of the tub or shower.  Attach bath mats securely with double-sided, nonslip rug tape.  If you need to sit down in the shower, use a plastic, nonslip stool.  Install grab bars by the toilet and in the tub and shower. Do not use towel bars as grab bars.      What can I do in the bedroom?  Make sure that you have a light by your bed that is easy to reach.  Do not use any sheets or blankets for your bed that hang to the floor.  Have a firm chair with side arms that you can use for support  when you get dressed. What can I do in the kitchen?  Clean up any spills right away.  If you need to reach something above you, use a step stool with a grab bar.  Keep electrical cords out of the way.  Do not use floor polish or wax that makes floors slippery. What can I do with my stairs?  Do not leave any items on the stairs.  Make sure that you have a light switch at the top and the bottom of the stairs.  Make sure that there are handrails on both sides of the stairs. Fix handrails that are broken or loose.  Install nonslip stair treads on all your stairs.  Avoid having throw rugs at the top or bottom of the stairs.  Choose a carpet that does not hide the edge of the steps on the stairs.  Check carpeting to make sure that it is firmly attached to the stairs. Fix carpet that is loose or worn. What can I do on the outside of my home?  Use bright outdoor lighting.  Fix the edges of walkways and driveways and fix any cracks.  Remove anything that might make you trip as you walk through a door, such as a raised step or threshold.  Trim any bushes or trees on paths to your home.  Check to see if handrails are loose or broken and that both sides of all steps have handrails.  Install guardrails along the edges of any raised decks and porches.  Clear paths of anything that can make you trip, such as tools or rocks.  Have leaves, snow, or ice cleared regularly.  Use sand or salt on paths during winter.  Clean up any spills in your garage right away. This includes grease or oil spills. What other actions can I take?  Wear shoes that: ? Have a low heel. Do not wear high heels. ? Have rubber bottoms. ? Feel good on your feet and fit well. ? Are closed at the toe. Do not wear open-toe sandals.  Use tools that help you move around if needed. These include: ? Canes. ? Walkers. ? Scooters. ? Crutches.  Review your medicines with your doctor. Some medicines  can make you  feel dizzy. This can increase your chance of falling. Ask your doctor what else you can do to help prevent falls. Where to find more information  Centers for Disease Control and Prevention, STEADI: http://www.wolf.info/  National Institute on Aging: http://kim-miller.com/ Contact a doctor if:  You are afraid of falling at home.  You feel weak, drowsy, or dizzy at home.  You fall at home. Summary  There are many simple things that you can do to make your home safe and to help prevent falls.  Ways to make your home safe include removing things that can make you trip and installing grab bars in the bathroom.  Ask for help when making these changes in your home. This information is not intended to replace advice given to you by your health care provider. Make sure you discuss any questions you have with your health care provider. Document Revised: 12/15/2019 Document Reviewed: 12/15/2019 Elsevier Patient Education  2021 [place].   Our clinic's number is [phone]. Please call with questions or concerns about what we discussed today.

## 2020-09-11 ENCOUNTER — Other Ambulatory Visit: Payer: Self-pay

## 2020-09-11 DIAGNOSIS — I10 Essential (primary) hypertension: Secondary | ICD-10-CM

## 2020-09-11 MED ORDER — METOPROLOL SUCCINATE ER 25 MG PO TB24: 1.0000 | ORAL_TABLET | Freq: Every day | ORAL | 3 refills | Status: DC

## 2020-09-13 ENCOUNTER — Other Ambulatory Visit: Payer: Self-pay

## 2020-09-13 DIAGNOSIS — E7849 Other hyperlipidemia: Secondary | ICD-10-CM

## 2020-09-13 MED ORDER — ATORVASTATIN CALCIUM 40 MG PO TABS: 1.0000 | ORAL_TABLET | Freq: Every day | ORAL | 3 refills | Status: DC

## 2020-09-28 DIAGNOSIS — N189 Chronic kidney disease, unspecified: Secondary | ICD-10-CM | POA: Diagnosis not present

## 2020-09-28 DIAGNOSIS — I129 Hypertensive chronic kidney disease with stage 1 through stage 4 chronic kidney disease, or unspecified chronic kidney disease: Secondary | ICD-10-CM | POA: Diagnosis not present

## 2020-09-28 DIAGNOSIS — N2581 Secondary hyperparathyroidism of renal origin: Secondary | ICD-10-CM | POA: Diagnosis not present

## 2020-09-28 DIAGNOSIS — D631 Anemia in chronic kidney disease: Secondary | ICD-10-CM | POA: Diagnosis not present

## 2020-09-28 DIAGNOSIS — E875 Hyperkalemia: Secondary | ICD-10-CM | POA: Diagnosis not present

## 2020-09-28 DIAGNOSIS — R42 Dizziness and giddiness: Secondary | ICD-10-CM | POA: Diagnosis not present

## 2020-09-28 DIAGNOSIS — N1831 Chronic kidney disease, stage 3a: Secondary | ICD-10-CM | POA: Diagnosis not present

## 2020-09-29 DIAGNOSIS — H401112 Primary open-angle glaucoma, right eye, moderate stage: Secondary | ICD-10-CM | POA: Diagnosis not present

## 2020-09-29 DIAGNOSIS — H401123 Primary open-angle glaucoma, left eye, severe stage: Secondary | ICD-10-CM | POA: Diagnosis not present

## 2020-09-29 DIAGNOSIS — H43813 Vitreous degeneration, bilateral: Secondary | ICD-10-CM | POA: Diagnosis not present

## 2020-09-29 DIAGNOSIS — H25813 Combined forms of age-related cataract, bilateral: Secondary | ICD-10-CM | POA: Diagnosis not present

## 2020-09-29 DIAGNOSIS — H43822 Vitreomacular adhesion, left eye: Secondary | ICD-10-CM | POA: Diagnosis not present

## 2020-11-10 ENCOUNTER — Ambulatory Visit (INDEPENDENT_AMBULATORY_CARE_PROVIDER_SITE_OTHER): Payer: Medicare Other | Admitting: Family Medicine

## 2020-11-10 ENCOUNTER — Encounter: Payer: Self-pay | Admitting: Family Medicine

## 2020-11-10 ENCOUNTER — Other Ambulatory Visit: Payer: Self-pay

## 2020-11-10 DIAGNOSIS — I739 Peripheral vascular disease, unspecified: Secondary | ICD-10-CM

## 2020-11-10 MED ORDER — CILOSTAZOL 50 MG PO TABS: 50.0000 mg | ORAL_TABLET | Freq: Two times a day (BID) | ORAL | 1 refills | Status: DC

## 2020-11-10 NOTE — Progress Notes (Signed)
    SUBJECTIVE:   CHIEF COMPLAINT / HPI:   Leg pain: Patient has bilateral leg pain in the back of the calves and lower hamstrings.  It gets worse when he walks for extended periods at times.  It gets better when he stops walking and rest.  He states this is the second or third time he has been seen for this.  No dyspnea on exertion.  No chest pain.  No swelling of the legs.  Patient is a pipe tobacco smoker.  No history of heart failure or stroke.  Patient does not remember previously being on a medication called Pletal.  He is on a statin medication which he states he takes.  PERTINENT  PMH / PSH: Claudication, CAD, PAD  OBJECTIVE:   BP 110/68   Pulse 70   Ht $R'5\' 9"'cv$  (1.753 m)   Wt 157 lb (71.2 kg)   BMI 23.18 kg/m   General: Alert and oriented.  No acute distress.  Accompanied by sister. CV: Regular rate and rhythm, no murmurs. Pulmonary: Lungs clear to auscultation bilaterally, no wheezes or crackles. Extremities: No edema.  Legs symmetric.  No calf tenderness, no popliteal tenderness.  ASSESSMENT/PLAN:   PAD (peripheral artery disease) (HCC) Previously diagnosed with claudication and PAD by Dr. Grandville Silos.  Prescribed Pletal in 2021.  Patient does not remember taking this medication.  Discussed the importance of the following treatment recommendations in the order of importance: Tobacco cessation, statin adherence, prescriptive walking.  We will restart Pletal.  Does not appear to have any contraindications to this.  Advised to follow-up with PCP.     Benay Pike, MD [place]

## 2020-11-10 NOTE — Patient Instructions (Signed)
It was nice to see you today,  You have something called peripheral artery disease which causes claudication.  Claudication is the pain you feel in your legs when you walk.  We discussed the treatments for this and they are as follows: - Tobacco smoking cessation is most important - Prescribed walking regimen: Walk until you feel the pain in your legs.  Do this every day but each day try to walk a little bit farther. - Continue taking your statin medication - I have prescribed a medicine called cilostazol.  You take it twice a day. - Follow-up with your PCP, Dr. Nancy Fetter, in 4 to 6 weeks.  Have a great day,  Clemetine Marker, MD

## 2020-11-12 NOTE — Assessment & Plan Note (Signed)
Previously diagnosed with claudication and PAD by Dr. Grandville Silos.  Prescribed Pletal in 2021.  Patient does not remember taking this medication.  Discussed the importance of the following treatment recommendations in the order of importance: Tobacco cessation, statin adherence, prescriptive walking.  We will restart Pletal.  Does not appear to have any contraindications to this.  Advised to follow-up with PCP.

## 2021-02-22 ENCOUNTER — Other Ambulatory Visit: Payer: Self-pay | Admitting: Family Medicine

## 2021-04-11 DIAGNOSIS — E875 Hyperkalemia: Secondary | ICD-10-CM | POA: Diagnosis not present

## 2021-04-11 DIAGNOSIS — N2581 Secondary hyperparathyroidism of renal origin: Secondary | ICD-10-CM | POA: Diagnosis not present

## 2021-04-11 DIAGNOSIS — D631 Anemia in chronic kidney disease: Secondary | ICD-10-CM | POA: Diagnosis not present

## 2021-04-11 DIAGNOSIS — I129 Hypertensive chronic kidney disease with stage 1 through stage 4 chronic kidney disease, or unspecified chronic kidney disease: Secondary | ICD-10-CM | POA: Diagnosis not present

## 2021-04-11 DIAGNOSIS — N1831 Chronic kidney disease, stage 3a: Secondary | ICD-10-CM | POA: Diagnosis not present

## 2021-04-11 DIAGNOSIS — N189 Chronic kidney disease, unspecified: Secondary | ICD-10-CM | POA: Diagnosis not present

## 2021-08-11 ENCOUNTER — Other Ambulatory Visit: Payer: Self-pay | Admitting: Family Medicine

## 2021-08-11 DIAGNOSIS — I10 Essential (primary) hypertension: Secondary | ICD-10-CM

## 2021-08-11 DIAGNOSIS — E7849 Other hyperlipidemia: Secondary | ICD-10-CM

## 2021-10-30 ENCOUNTER — Encounter: Payer: Self-pay | Admitting: *Deleted

## 2021-11-12 ENCOUNTER — Other Ambulatory Visit: Payer: Self-pay | Admitting: Family Medicine

## 2021-11-13 ENCOUNTER — Encounter: Payer: Self-pay | Admitting: Family Medicine

## 2021-11-13 ENCOUNTER — Ambulatory Visit (INDEPENDENT_AMBULATORY_CARE_PROVIDER_SITE_OTHER): Payer: Medicare Other | Admitting: Family Medicine

## 2021-11-13 VITALS — BP 121/84 | HR 75 | Ht 69.0 in | Wt 139.2 lb

## 2021-11-13 DIAGNOSIS — H9193 Unspecified hearing loss, bilateral: Secondary | ICD-10-CM

## 2021-11-13 DIAGNOSIS — I10 Essential (primary) hypertension: Secondary | ICD-10-CM

## 2021-11-13 DIAGNOSIS — R251 Tremor, unspecified: Secondary | ICD-10-CM | POA: Insufficient documentation

## 2021-11-13 DIAGNOSIS — E7849 Other hyperlipidemia: Secondary | ICD-10-CM | POA: Diagnosis not present

## 2021-11-13 DIAGNOSIS — I739 Peripheral vascular disease, unspecified: Secondary | ICD-10-CM

## 2021-11-13 DIAGNOSIS — R7303 Prediabetes: Secondary | ICD-10-CM | POA: Diagnosis not present

## 2021-11-13 DIAGNOSIS — R94120 Abnormal auditory function study: Secondary | ICD-10-CM | POA: Diagnosis not present

## 2021-11-13 MED ORDER — TETANUS-DIPHTH-ACELL PERTUSSIS 5-2.5-18.5 LF-MCG/0.5 IM SUSP: 0.5000 mL | Freq: Once | INTRAMUSCULAR | 0 refills | Status: AC

## 2021-11-13 NOTE — Assessment & Plan Note (Signed)
Suspect essential tremor based on history and exam.  No rest tremor to suggest Parkinson's.  Symptoms are minimally bothersome and he is already on beta-blocker.  We will check TSH to rule out hyperthyroidism.

## 2021-11-13 NOTE — Progress Notes (Signed)
SUBJECTIVE:   CHIEF COMPLAINT / HPI:  Chief Complaint  Patient presents with   Leg Pain    Smokes a pipe on occasion.  Pain in both legs with walking, getting worse.  He is now noticing pain with shorter distances of walking such as when walking across the street.  He continues to take atorvastatin, aspirin, and cilostazol.  Denies pain at rest.  Denies back pain/radiation of pain from his back to his legs.  He is asking about a rolling walker with a seat.  He currently uses a fold up seat that he carries.  He believes he would benefit from this so he can sit down and rest when walking due to leg claudication.  Patient has had an occasional tremor noticed when doing things such as putting sugar in his coffee.  Denies tremor at rest.  It is minimally bothersome and does not interfere with day-to-day activities.  Wife is also requesting a hearing test.  No concerns about hearing loss.  PERTINENT  PMH / PSH: PAD, tobacco use, CKD III, HTN  Patient Care Team: Zola Button, MD as PCP - General (Family Medicine) Donato Heinz, MD as Consulting Physician (Nephrology) [facility] Larey Dresser, MD as Consulting Physician (Cardiology)   OBJECTIVE:   BP 121/84   Pulse 75   Ht $R'5\' 9"'KV$  (1.753 m)   Wt 139 lb 3.2 oz (63.1 kg)   SpO2 100%   BMI 20.56 kg/m   Physical Exam Constitutional:      General: He is not in acute distress. HENT:     Head: Normocephalic and atraumatic.     Right Ear: Tympanic membrane normal.     Ears:     Comments: Significant amount of cerumen in the left ear Cardiovascular:     Rate and Rhythm: Normal rate and regular rhythm.     Pulses:          Dorsalis pedis pulses are 2+ on the right side and 2+ on the left side.       Posterior tibial pulses are 2+ on the right side and 2+ on the left side.  Pulmonary:     Effort: Pulmonary effort is normal. No respiratory distress.     Breath sounds: Normal breath sounds.   Musculoskeletal:        General: No signs of injury.     Right lower leg: No edema.     Left lower leg: No edema.     Comments: No point tenderness along spine  Skin:    General: Skin is warm and dry.     Findings: No lesion.  Neurological:     Mental Status: He is alert.     Comments: Fine positional tremor noted in left hand with arm outstretched.  No rest tremor noted.         11/13/2021    3:37 PM  Depression screen PHQ 2/9  Decreased Interest 0  Down, Depressed, Hopeless 0  PHQ - 2 Score 0  Altered sleeping 0  Tired, decreased energy 0  Change in appetite 0  Feeling bad or failure about yourself  0  Trouble concentrating 0  Moving slowly or fidgety/restless 3  Suicidal thoughts 0  PHQ-9 Score 3  Difficult doing work/chores Very difficult    Hearing Screening   '500Hz'$  $Remo'1000Hz'zCeZL$'2000Hz'$'4000Hz'$   Right ear 35 Fail Fail Fail  Left ear 35 Fail 40 Fail     {Show previous vital signs (optional):23777}  ASSESSMENT/PLAN:   PAD (peripheral artery disease) (HCC) Moderate PAD noted on ABI previously, already on statin, aspirin, and PDE 3 inhibitor.  Seems to be worsening with pain noticed that shorter distances walking, no rest pain currently and good pedal pulses on exam.  Encouraged smoking cessation and exercise.  He has been evaluated by vascular surgery in the past, may be reasonable to have him follow-up with vascular surgery sooner if continues to worsen.  I think he would benefit from rolling walker with seat given frequent need to sit down to rest due to claudication, I have placed the DME order.  Decreased hearing No concern for hearing loss but did fail hearing screen which was checked per daughter request.  He did have significant cerumen in his left ear. - advised to use OTC Debrox - audiology referral  Essential hypertension Well-controlled, continue current meds.  Check metabolic panel  Hyperlipidemia On statin, check lipid panel  Tremor Suspect  essential tremor based on history and exam.  No rest tremor to suggest Parkinson's.  Symptoms are minimally bothersome and he is already on beta-blocker.  We will check TSH to rule out hyperthyroidism.   HCM - shingles vaccine discussed, will plan to obtain at pharmacy - Tdap Rx written - check A1c  Return in about 6 months (around 05/15/2022) for f/u HTN.   Zola Button, MD [place]

## 2021-11-13 NOTE — Assessment & Plan Note (Signed)
On statin, check lipid panel

## 2021-11-13 NOTE — Assessment & Plan Note (Signed)
No concern for hearing loss but did fail hearing screen which was checked per daughter request.  He did have significant cerumen in his left ear. - advised to use OTC Debrox - audiology referral

## 2021-11-13 NOTE — Assessment & Plan Note (Addendum)
Moderate PAD noted on ABI previously, already on statin, aspirin, and PDE 3 inhibitor.  Seems to be worsening with pain noticed that shorter distances walking, no rest pain currently and good pedal pulses on exam.  Encouraged smoking cessation and exercise.  He has been evaluated by vascular surgery in the past, may be reasonable to have him follow-up with vascular surgery sooner if continues to worsen.  I think he would benefit from rolling walker with seat given frequent need to sit down to rest due to claudication, I have placed the DME order.

## 2021-11-13 NOTE — Patient Instructions (Addendum)
It was nice seeing you today!  Try to walk as much as you can to help with the circulation in your legs.  I will work on ordering a rolling walker.  Checking blood work today.  Get the tetanus shot and shingles vaccine at your pharmacy.  See you in 6 months.  Stay well, Zola Button, MD [place] [phone]  --  Make sure to check out at the front desk before you leave today.  Please arrive at least 15 minutes prior to your scheduled appointments.  If you had blood work today, I will send you a MyChart message or a letter if results are normal. Otherwise, I will give you a call.  If you had a referral placed, they will call you to set up an appointment. Please give [place] a call if you don't hear back in the next 2 weeks.  If you need additional refills before your next appointment, please call your pharmacy first.

## 2021-11-13 NOTE — Assessment & Plan Note (Signed)
Well-controlled, continue current meds.  Check metabolic panel

## 2021-11-14 ENCOUNTER — Telehealth: Payer: Self-pay

## 2021-11-14 LAB — LIPID PANEL
Chol/HDL Ratio: 2 ratio (ref 0.0–5.0)
Cholesterol, Total: 173 mg/dL (ref 100–199)
HDL: 86 mg/dL (ref >39)
LDL Chol Calc (NIH): 76 mg/dL (ref 0–99)
Triglycerides: 56 mg/dL (ref 0–149)
VLDL Cholesterol Cal: 11 mg/dL (ref 5–40)

## 2021-11-14 LAB — COMPREHENSIVE METABOLIC PANEL
ALT: 10 IU/L (ref 0–44)
AST: 17 IU/L (ref 0–40)
Albumin/Globulin Ratio: 1.3 (ref 1.2–2.2)
Albumin: 3.9 g/dL (ref 3.7–4.7)
Alkaline Phosphatase: 153 IU/L — ABNORMAL HIGH (ref 44–121)
BUN/Creatinine Ratio: 14 (ref 10–24)
BUN: 15 mg/dL (ref 8–27)
Bilirubin Total: 1.1 mg/dL (ref 0.0–1.2)
CO2: 21 mmol/L (ref 20–29)
Calcium: 9.3 mg/dL (ref 8.6–10.2)
Chloride: 101 mmol/L (ref 96–106)
Creatinine, Ser: 1.11 mg/dL (ref 0.76–1.27)
Globulin, Total: 3.1 g/dL (ref 1.5–4.5)
Glucose: 84 mg/dL (ref 70–99)
Potassium: 4.6 mmol/L (ref 3.5–5.2)
Sodium: 138 mmol/L (ref 134–144)
Total Protein: 7 g/dL (ref 6.0–8.5)
eGFR: 67 mL/min/{1.73_m2} (ref >59)

## 2021-11-14 LAB — HEMOGLOBIN A1C
Est. average glucose Bld gHb Est-mCnc: 100 mg/dL
Hgb A1c MFr Bld: 5.1 % (ref 4.8–5.6)

## 2021-11-14 LAB — TSH RFX ON ABNORMAL TO FREE T4: TSH: 2.33 u[IU]/mL (ref 0.450–4.500)

## 2021-11-14 NOTE — Telephone Encounter (Signed)
Community message sent to [employer] for rolling walker.   Will await confirmation.

## 2021-11-19 DIAGNOSIS — M6281 Muscle weakness (generalized): Secondary | ICD-10-CM | POA: Diagnosis not present

## 2021-11-22 ENCOUNTER — Ambulatory Visit: Payer: Medicare Other | Attending: Audiologist | Admitting: Audiologist

## 2021-11-22 DIAGNOSIS — H903 Sensorineural hearing loss, bilateral: Secondary | ICD-10-CM | POA: Diagnosis not present

## 2021-11-22 NOTE — Procedures (Signed)
  Outpatient Audiology and [place], [place]  [id number] [phone]  AUDIOLOGICAL  EVALUATION  NAME: Matthew [name]     DOB:   1941/01/17      MRN: [id number]                                                                                     DATE: 11/22/2021     REFERENT: Zola Button, MD STATUS: Outpatient DIAGNOSIS: Sensorineural Hearing Loss    History: Jerl was seen for an audiological evaluation due to him turning up the TV loudly. Pacer lives in a senior living center, and says his TV is just as loud as everyone else. Daughter feels Rudra is not hearing well. This has been going on for over a year. Trinidad denied pain, pressure, or tinnitus in either ear. 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 Medical review shows chronic kidney disease stage three which can impact hearing. No other case history reported.    Evaluation:  Otoscopy showed a clear view of the tympanic membrane in the right ear and non occluding cerumen in the left ear  Tympanometry results were consistent with normal middle ear function, bilaterally   Audiometric testing was completed using Conventional Audiometry techniques with insert earphones and TDH headphones. Test results are consistent with mild sloping to moderately severe sensorineural  hearing loss bilaterally. Speech Recognition Thresholds were obtained at 45dB HL in the right ear and at 35dB HL in the left ear. Word Recognition Testing was completed at  40dB SL with masking and Ercil scored 100% in each ear.    Results:  The test results were reviewed with Lucile and his daughter. He was informed of the nature and degree of his hearing loss. 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 Elisah is a candidate for hearing aids to help him have access to high frequency sounds again. Without these sounds speech will be muffled and unclear. Giancarlo does not  want to utilize hearing aids at this time. His daughter was instructed to talk face to face within five feet so Levoy can lip-read the high pitched sounds he misses. If he feels motivated to pursue hearing aids, then a new test is warranted after six months from today's date.   Recommendations: 1.   No further testing is recommended at this time. If more hearing difficulties are observed further audiological testing is recommended. Braxson does not want hearing aids at this time.          35 minutes spent testing and counseling on results.   If you have any questions please feel free to contact me at [phone].  Alfonse Alpers  Audiologist, Au.D., CCC-A 11/22/2021  5:38 PM  Cc: Zola Button, MD

## 2021-12-07 NOTE — Telephone Encounter (Signed)
Spoke with patients daughter and patient received DME equipment.

## 2022-01-01 ENCOUNTER — Ambulatory Visit (INDEPENDENT_AMBULATORY_CARE_PROVIDER_SITE_OTHER): Payer: Medicare Other

## 2022-01-01 DIAGNOSIS — Z0000 Encounter for general adult medical examination without abnormal findings: Secondary | ICD-10-CM | POA: Diagnosis not present

## 2022-01-01 NOTE — Patient Instructions (Addendum)
Matthew [name] for taking time to come for your Medicare Wellness Visit. I appreciate your ongoing commitment to your health goals. Please review the following plan we discussed and let me know if I can assist [name] in the future.    These are the goals we discussed:   Goals      DIET - INCREASE WATER INTAKE     Drinks sweet tea.      Patient Stated     Continue being active and maintain current health.        We also discussed recommended health maintenance.  As discussed, [name] are due for: Health Maintenance  Topic Date Due   Zoster Vaccines- Shingrix (1 of 2) Never done   TETANUS/TDAP  07/02/2014   COVID-19 Vaccine (5 - Pfizer series) 10/31/2020   INFLUENZA VACCINE  12/25/2021   Pneumonia Vaccine 51+ Years old  Completed   HPV VACCINES  Aged Out   Follow-up with PCP in December. Discuss shingles vaccine at next PCP visit.  [facility] will have flu vaccines next month! Please call to schedule your flu vaccine apt.   We also discussed smoking cessation.  Preventive Care 30 Years and Older, Male Preventive care refers to lifestyle choices and visits with your health care provider that can promote health and wellness. Preventive care visits are also called wellness exams. What can I expect for my preventive care visit? Counseling During your preventive care visit, your health care provider may ask about your: Medical history, including: Past medical problems. Family medical history. History of falls. Current health, including: Emotional well-being. Home life and relationship well-being. Sexual activity. Memory and ability to understand (cognition). Lifestyle, including: Alcohol, nicotine or tobacco, and drug use. Access to firearms. Diet, exercise, and sleep habits. Work and work Statistician. Sunscreen use. Safety issues such as seatbelt and bike helmet use. Physical exam Your health care provider will check your: Height and weight. These may be used to calculate your  BMI (body mass index). BMI is a measurement that tells if [name] are at a healthy weight. Waist circumference. This measures the distance around your waistline. This measurement also tells if [name] are at a healthy weight and may help predict your risk of certain diseases, such as type 2 diabetes and high blood pressure. Heart rate and blood pressure. Body temperature. Skin for abnormal spots. What immunizations do I need?  Vaccines are usually given at various ages, according to a schedule. Your health care provider will recommend vaccines for [name] based on your age, medical history, and lifestyle or other factors, such as travel or where [name] work. What tests do I need? Screening Your health care provider may recommend screening tests for certain conditions. This may include: Lipid and cholesterol levels. Diabetes screening. This is done by checking your blood sugar (glucose) after [name] have not eaten for a while (fasting). Hepatitis C test. Hepatitis B test. HIV (human immunodeficiency virus) test. STI (sexually transmitted infection) testing, if [name] are at risk. Lung cancer screening. Colorectal cancer screening. Prostate cancer screening. Abdominal aortic aneurysm (AAA) screening. [name] may need this if [name] are a current or former smoker. Talk with your health care provider about your test results, treatment options, and if necessary, the need for more tests. Follow these instructions at home: Eating and drinking  Eat a diet that includes fresh fruits and vegetables, whole grains, lean protein, and low-fat dairy products. Limit your intake of foods with high amounts of sugar, saturated fats, and salt. Take vitamin  and mineral supplements as recommended by your health care provider. Do not drink alcohol if your health care provider tells [name] not to drink. If [name] drink alcohol: Limit how much [name] have to 0-2 drinks a day. Know how much alcohol is in your drink. In the [place] one drink equals  one 12 oz bottle of beer (355 mL), one 5 oz glass of wine (148 mL), or one 1 oz glass of hard liquor (44 mL). Lifestyle Brush your teeth every morning and night with fluoride toothpaste. Floss one time each day. Exercise for at least 30 minutes 5 or more days each week. Do not use any products that contain nicotine or tobacco. These products include cigarettes, chewing tobacco, and vaping devices, such as e-cigarettes. If [name] need help quitting, ask your health care provider. Do not use drugs. If [name] are sexually active, practice safe sex. Use a condom or other form of protection to prevent STIs. Take aspirin only as told by your health care provider. Make sure that [name] understand how much to take and what form to take. Work with your health care provider to find out whether it is safe and beneficial for [name] to take aspirin daily. Ask your health care provider if [name] need to take a cholesterol-lowering medicine (statin). Find healthy ways to manage stress, such as: Meditation, yoga, or listening to music. Journaling. Talking to a trusted person. Spending time with friends and family. Safety Always wear your seat belt while driving or riding in a vehicle. Do not drive: If [name] have been drinking alcohol. Do not ride with someone who has been drinking. When [name] are tired or distracted. While texting. If [name] have been using any mind-altering substances or drugs. Wear a helmet and other protective equipment during sports activities. If [name] have firearms in your house, make sure [name] follow all gun safety procedures. Minimize exposure to UV radiation to reduce your risk of skin cancer. What's next? Visit your health care provider once a year for an annual wellness visit. Ask your health care provider how often [name] should have your eyes and teeth checked. Stay up to date on all vaccines. This information is not intended to replace advice given to [name] by your health care provider. Make sure [name]  discuss any questions [name] have with your health care provider. Document Revised: 11/08/2020 Document Reviewed: 11/08/2020 Elsevier Patient Education  [facility]'s number is [phone]. Please call with questions or concerns about what we discussed today.

## 2022-01-01 NOTE — Progress Notes (Addendum)
Subjective:   Matthew [name] is a 81 y.o. male who presents for Medicare Annual/Subsequent preventive examination.  The patient consented to a virtual visit. Patient consented to have virtual visit and was identified by name and date of birth. Method of visit: Telephone  Encounter participants: Patient: Matthew [name] - located at Home Nurse/Provider: Dorna Bloom - located at [facility] Others (if applicable): [name]- Daughter  Review of Systems: Defer to PCP  Cardiac Risk Factors include: advanced age (>80men, >74 women);hypertension;male gender;smoking/ tobacco exposure  Objective:    Vitals: There were no vitals taken for this visit.  There is no height or weight on file to calculate BMI.     01/01/2022    4:26 PM 01/01/2022    4:03 PM 11/10/2020    3:51 PM 09/05/2020    2:56 PM 11/19/2019    8:50 AM 07/20/2018    3:44 PM 04/13/2018    3:50 PM  Advanced Directives  Does Patient Have a Medical Advance Directive?  Yes No Yes Yes No No  Type of Corporate treasurer of [place];Living will  [place];Living will Living will    Does patient want to make changes to medical advance directive?  No - Patient declined   Yes (ED - Information included in AVS)    Copy of [place] in Chart? Yes - validated most recent copy scanned in chart (See row information) No - copy requested  Yes - validated most recent copy scanned in chart (See row information)     Would patient like information on creating a medical advance directive?   No - Patient declined   No - Patient declined No - Patient declined   Tobacco Social History   Tobacco Use  Smoking Status Every Day   Packs/day: 0.30   Years: 51.00   Total pack years: 15.30   Types: Pipe, Cigarettes   Start date: 05/1966   Passive exposure: Current  Smokeless Tobacco Never  Tobacco Comments   Pipe smoker     Ready to quit: No Counseling given: Yes Tobacco comments: Pipe  smoker  Clinical Intake:  Pre-visit preparation completed: Yes  Pain Score: 0-No pain  Diabetes: No  How often do you need to have someone help you when you read instructions, pamphlets, or other written materials from your doctor or pharmacy?: 2 - Rarely What is the last grade level you completed in school?: High School  Interpreter Needed?: No  Past Medical History:  Diagnosis Date   ADENOCARCINOMA, PROSTATE 10/25/2008   Qualifier: Diagnosis of  By: Buelah Manis MD, Lonell Grandchild   S/p robotic prostatectomy in 2010    [place], SEASONAL 10/16/2009   Qualifier: Diagnosis of  By: Buelah Manis MD, Lonell Grandchild     CHRONIC KIDNEY DISEASE STAGE 3 07/24/2006   Qualifier: Diagnosis of  By: Herma Ard   Followed by Dr. Louanna Raw at [facility], avoid nephrotoxic agents    Coronary atherosclerosis of artery bypass graft 10/20/2009   Qualifier: Diagnosis of  By: Mack Guise     Decreased hearing 01/15/2013   ESOPHAGEAL ULCER, WITH BLEEDING 10/25/2008   Qualifier: Diagnosis of  By: Buelah Manis MD, Delray Alt 08/29/2009   Qualifier: Diagnosis of  By: Lynden Ang   Statin therapy    HYPERTENSION, BENIGN SYSTEMIC 07/24/2006   Qualifier: Diagnosis of  By: Herma Ard     SECONDARY HYPERPARATHYROIDISM 05/04/2007   Qualifier: Diagnosis of  By: [place], Bonnie  TOBACCO ABUSE 08/10/2007   Qualifier: Diagnosis of  By: Girard Cooter MD,      Transfusion history 2010   Unstable angina ([place]) 01/15/2013   Past Surgical History:  Procedure Laterality Date   CORONARY ARTERY BYPASS GRAFT     INGUINAL HERNIA REPAIR  2009   PROSTATECTOMY  2010   robotic   Family History  Problem Relation Age of Onset   Kidney disease Mother    Hypertension Brother    Hypertension Other    Prostate cancer Other        grandfather   Social History   Socioeconomic History   Marital status: Widowed    Spouse name: Not on file   Number of children: 4   Years of education: 75    Highest education level: 12th grade  Occupational History    Comment: truck driver - retired  Tobacco Use   Smoking status: Every Day    Packs/day: 0.30    Years: 51.00    Total pack years: 15.30    Types: Pipe, Cigarettes    Start date: 05/1966    Passive exposure: Current   Smokeless tobacco: Never   Tobacco comments:    Pipe smoker  Vaping Use   Vaping Use: Never used  Substance and Sexual Activity   Alcohol use: Not Currently   Drug use: No   Sexual activity: Not Currently  Other Topics Concern   Not on file  Social History Narrative   Patient lives alone in [place].    Patients daughter [name] lives in the area.    Patient does not drive and she is his form of transportation.    [name] helps organize his medications and helps with grocery/pharmacy shopping needs.    Patient walks ~ 10 minutes each day.   Social Determinants of Health   Financial Resource Strain: Low Risk  (01/01/2022)   Overall Financial Resource Strain (CARDIA)    Difficulty of Paying Living Expenses: Not hard at all  Food Insecurity: No Food Insecurity (01/01/2022)   Hunger Vital Sign    Worried About Running Out of Food in the Last Year: Never true    Ran Out of Food in the Last Year: Never true  Transportation Needs: No Transportation Needs (01/01/2022)   PRAPARE - Hydrologist (Medical): No    Lack of Transportation (Non-Medical): No  Physical Activity: Insufficiently Active (01/01/2022)   Exercise Vital Sign    Days of Exercise per Week: 7 days    Minutes of Exercise per Session: 10 min  Stress: No Stress Concern Present (01/01/2022)   [place]    Feeling of Stress : Not at all  Social Connections: Moderately Isolated (01/01/2022)   Social Connection and Isolation Panel [NHANES]    Frequency of Communication with Friends and Family: More than three times a week    Frequency of Social Gatherings with  Friends and Family: More than three times a week    Attends Religious Services: More than 4 times per year    Active Member of [employer] or Organizations: No    Attends Archivist Meetings: Never    Marital Status: Widowed   Outpatient Encounter Medications as of 01/01/2022  Medication Sig   amLODipine (NORVASC) 10 MG tablet TAKE 1 TABLET BY MOUTH  DAILY   aspirin 81 MG tablet Take 81 mg by mouth daily.   atorvastatin (LIPITOR) 40 MG tablet TAKE 1 TABLET  BY MOUTH  DAILY   calcitRIOL (ROCALTROL) 0.25 MCG capsule Take by mouth. Takes Monday, Wednesday, and Friday   latanoprost (XALATAN) 0.005 % ophthalmic solution Place 1 drop into both eyes at bedtime.   metoprolol succinate (TOPROL-XL) 25 MG 24 hr tablet TAKE 1 TABLET BY MOUTH  DAILY   cilostazol (PLETAL) 50 MG tablet Take 1 tablet (50 mg total) by mouth 2 (two) times daily. (Patient not taking: Reported on 01/01/2022)   No facility-administered encounter medications on file as of 01/01/2022.   Activities of Daily Living    01/01/2022    4:03 PM  In your present state of health, do you have any difficulty performing the following activities:  Hearing? 1  Vision? 1  Difficulty concentrating or making decisions? 0  Walking or climbing stairs? 1  Dressing or bathing? 0  Doing errands, shopping? 1  Preparing Food and eating ? N  Using the Toilet? N  In the past six months, have you accidently leaked urine? N  Do you have problems with loss of bowel control? N  Managing your Medications? Y  Managing your Finances? Y  Housekeeping or managing your Housekeeping? N   Patient Care Team: Zola Button, MD as PCP - General (Family Medicine) Donato Heinz, MD as Consulting Physician (Nephrology) [facility] Larey Dresser, MD as Consulting Physician (Cardiology)    Assessment:   This is a routine wellness examination for [place].  Exercise Activities and Dietary recommendations Current Exercise Habits: Home  exercise routine, Time (Minutes): 10, Frequency (Times/Week): 7, Weekly Exercise (Minutes/Week): 70, Intensity: Mild   Goals      DIET - INCREASE WATER INTAKE     Drinks sweet tea.      Patient Stated     Continue being active and maintain current health.        Fall Risk    01/01/2022    4:26 PM 11/10/2020    3:51 PM 09/05/2020    2:56 PM 11/19/2019    8:51 AM 07/20/2018    3:45 PM  [place] in the past year? 0 0 0 0 0  Number falls in past yr: 0 0  0   Injury with Fall? 0   0   Risk for fall due to : Impaired balance/gait;Impaired mobility      Follow up Falls prevention discussed  Falls prevention discussed     Patient denies any falls in last year. Patient reports he does have a walker he uses to ambulate outside of his home.  Is the patient's home free of loose throw rugs in walkways, pet beds, electrical cords, etc?   yes      Grab bars in the bathroom? yes      Handrails on the stairs?   yes      Adequate lighting?   yes  Patient rating of health (0-10): 8   Depression Screen    01/01/2022    4:00 PM 11/13/2021    3:37 PM 11/10/2020    3:51 PM 09/05/2020    2:57 PM  PHQ 2/9 Scores  PHQ - 2 Score 0 0 1 0  PHQ- 9 Score $Remov'3 3 1    'emwiai$ Cognitive Function    07/20/2018    3:47 PM  MMSE - Mini Mental State Exam  Orientation to time 5  Orientation to Place 5  Registration 3  Attention/ Calculation 5  Recall 3  Language- name 2 objects 2  Language- repeat 1  Language- follow 3 step command 3  Language- read & follow direction 1  Write a sentence 1  Copy design 1  Total score 30      01/01/2022    4:26 PM 09/05/2020    2:58 PM 07/20/2018    3:48 PM  6CIT Screen  What Year? 0 points 0 points 0 points  What month? 0 points 0 points 0 points  What time? 0 points 0 points 0 points  Count back from 20 2 points 0 points 0 points  Months in reverse 2 points 0 points 0 points  Repeat phrase 0 points 0 points 0 points  Total Score 4 points 0 points 0 points    Immunization History  Administered Date(s) Administered   Fluad Quad(high Dose 65+) 06/08/2020   Influenza Split 03/18/2011   Influenza Whole 03/03/2008   Influenza,inj,Quad PF,6+ Mos 04/26/2013, 04/03/2015, 03/04/2017, 01/20/2018, 02/23/2019   Influenza-Unspecified 03/18/2016   PFIZER(Purple Top)SARS-COV-2 Vaccination 07/18/2019, 08/11/2019, 03/10/2020, 09/05/2020   Pneumococcal Conjugate-13 04/03/2015   Pneumococcal Polysaccharide-23 03/03/2008   Td 07/02/2004   Qualifies for Shingles Vaccine? Yes   Shingrix Completed: No, Education has been provided regarding the importance of this vaccine. Advised may receive this vaccine at local pharmacy or Health Dept. Aware to provide a copy of the vaccination record if obtained from local pharmacy or Health Dept. Verbalized acceptance and understanding.   Qualifies for Tetanus Vaccine? Yes   Tdap Completed: No, Education has been provided regarding the importance of this vaccine. Advised may receive this vaccine at local pharmacy or Health Dept. Aware to provide a copy of the vaccination record if obtained from local pharmacy or Health Dept. Verbalized acceptance and understanding.  Screening Tests Health Maintenance  Topic Date Due   Zoster Vaccines- Shingrix (1 of 2) Never done   TETANUS/TDAP  07/02/2014   COVID-19 Vaccine (5 - Pfizer series) 10/31/2020   INFLUENZA VACCINE  12/25/2021   Pneumonia Vaccine 73+ Years old  Completed   HPV VACCINES  Aged Out   Cancer Screenings: Lung: Low Dose CT Chest recommended if Age 75-80 years, 30 pack-year currently smoking OR have quit w/in 15years. Patient does not qualify. Colorectal: No longer recommended  Additional Screenings: Hepatitis C Screening: Completed   Plan:  Follow-up with PCP in December. Discuss shingles vaccine at next PCP visit.  [facility] will have flu vaccines next month! Please call to schedule your flu vaccine apt.   I have personally reviewed and noted the following in the  patient's chart:   Medical and social history Use of alcohol, tobacco or illicit drugs  Current medications and supplements Functional ability and status Nutritional status Physical activity Advanced directives List of other physicians Hospitalizations, surgeries, and ER visits in previous 12 months Vitals Screenings to include cognitive, depression, and falls Referrals and appointments  In addition, I have reviewed and discussed with patient certain preventive protocols, quality metrics, and best practice recommendations. A written personalized care plan for preventive services as well as general preventive health recommendations were provided to patient.  Dorna Bloom, [place]  01/01/2022   I have reviewed this visit and agree with the documentation.

## 2022-05-01 NOTE — Progress Notes (Signed)
    SUBJECTIVE:   CHIEF COMPLAINT / HPI:  Chief Complaint  Patient presents with   Rash    X 1 month , itchy    Patient here with daughter today.  He presents with ongoing pruritic rash for about 1 month to his bilateral arms and back.  He is currently going through a bedbug infestation and has had to get an exterminator to his house.  He still needs to have his living room chairs removed but the infestation is close to being under control.  He has been applying alcohol to his rash whenever it itches.  He is doing better with the rolling walker.  He is able to ambulate to further distances and will sometimes have to sit and rest on his walker due to leg pain..  Denies any rest pain.  PERTINENT  PMH / PSH: PAD, tobacco use ([place] pipe tobacco, 2-3 puffs per day), CKD III, HTN  Patient Care Team: Zola Button, MD as PCP - General (Family Medicine) Donato Heinz, MD as Consulting Physician (Nephrology) [facility] Larey Dresser, MD as Consulting Physician (Cardiology)   OBJECTIVE:   BP 121/76   Pulse 66   Ht $R'5\' 9"'Hl$  (1.753 m)   Wt 144 lb 2 oz (65.4 kg)   SpO2 100%   BMI 21.28 kg/m   Physical Exam Constitutional:      General: He is not in acute distress.    Comments: 3 live bedbugs were pulled off the patient by the daughter during today's encounter.  Cardiovascular:     Rate and Rhythm: Normal rate and regular rhythm.  Pulmonary:     Effort: Pulmonary effort is normal. No respiratory distress.     Breath sounds: Normal breath sounds.  Skin:    Comments: Eczematous hyperpigmented scaly plaques scattered throughout bilateral upper extremities and torso.  Sign of Leser-Trelat noted.  Neurological:     Mental Status: He is alert.             {Show previous vital signs (optional):23777}    ASSESSMENT/PLAN:   Post-inflammatory hyperpigmentation Rash is consistent with postinflammatory hyperpigmentation secondary to scratching which  is secondary to bedbug infestation which is currently being controlled. - triamcinolone ointment 0.5% BID x 1-2 weeks then prn - advised to stop using alcohol  PAD (peripheral artery disease) (HCC) Stable, doing well with rolling walker.  Denies rest pain.  TOBACCO ABUSE Continues to smoke pipe tobacco, precontemplative.   HCM - flu vaccine given - Covid vaccine given  Return in about 6 months (around 11/02/2022) for f/u HTN.   Zola Button, MD [place]

## 2022-05-01 NOTE — Patient Instructions (Addendum)
It was nice seeing you today!  Apply triamcinolone ointment twice a day for 2 weeks, then use twice a day as needed.  Outpatient Audiology and [place], [place]  [phone]   Stay well, Zola Button, MD [place] [phone]  --  Make sure to check out at the front desk before you leave today.  Please arrive at least 15 minutes prior to your scheduled appointments.  If you had blood work today, I will send you a MyChart message or a letter if results are normal. Otherwise, I will give you a call.  If you had a referral placed, they will call you to set up an appointment. Please give [place] a call if you don't hear back in the next 2 weeks.  If you need additional refills before your next appointment, please call your pharmacy first.

## 2022-05-03 ENCOUNTER — Encounter: Payer: Self-pay | Admitting: Family Medicine

## 2022-05-03 ENCOUNTER — Ambulatory Visit (INDEPENDENT_AMBULATORY_CARE_PROVIDER_SITE_OTHER): Payer: Medicare Other | Admitting: Family Medicine

## 2022-05-03 VITALS — BP 121/76 | HR 66 | Ht 69.0 in | Wt 144.1 lb

## 2022-05-03 DIAGNOSIS — F17200 Nicotine dependence, unspecified, uncomplicated: Secondary | ICD-10-CM

## 2022-05-03 DIAGNOSIS — I739 Peripheral vascular disease, unspecified: Secondary | ICD-10-CM

## 2022-05-03 DIAGNOSIS — Z23 Encounter for immunization: Secondary | ICD-10-CM | POA: Diagnosis not present

## 2022-05-03 DIAGNOSIS — L810 Postinflammatory hyperpigmentation: Secondary | ICD-10-CM

## 2022-05-03 MED ORDER — TRIAMCINOLONE ACETONIDE 0.5 % EX OINT: 1.0000 | TOPICAL_OINTMENT | Freq: Two times a day (BID) | CUTANEOUS | 0 refills | Status: DC

## 2022-05-03 NOTE — Assessment & Plan Note (Signed)
Stable, doing well with rolling walker.  Denies rest pain.

## 2022-05-03 NOTE — Assessment & Plan Note (Signed)
Continues to smoke pipe tobacco, precontemplative.

## 2022-05-18 ENCOUNTER — Other Ambulatory Visit: Payer: Self-pay | Admitting: Family Medicine

## 2022-05-18 DIAGNOSIS — I10 Essential (primary) hypertension: Secondary | ICD-10-CM

## 2022-05-18 DIAGNOSIS — E7849 Other hyperlipidemia: Secondary | ICD-10-CM

## 2022-08-22 ENCOUNTER — Other Ambulatory Visit: Payer: Self-pay | Admitting: Family Medicine

## 2023-01-19 NOTE — Patient Instructions (Incomplete)
Matthew [name] , Thank you for taking time to come for your Medicare Wellness Visit. I appreciate your ongoing commitment to your health goals. Please review the following plan we discussed and let me know if I can assist you in the future.   Referrals/Orders/Follow-Ups/Clinician Recommendations: Aim for 30 minutes of exercise or brisk walking, 6-8 glasses of water, and 5 servings of fruits and vegetables each day.  This is a list of the screening recommended for you and due dates:  Health Maintenance  Topic Date Due   Zoster (Shingles) Vaccine (1 of 2) Never done   DTaP/Tdap/Td vaccine (2 - Tdap) 07/02/2014   COVID-19 Vaccine (6 - 2023-24 season) 09/02/2022   Medicare Annual Wellness Visit  01/02/2023   Flu Shot  12/26/2022   Pneumonia Vaccine  Completed   HPV Vaccine  Aged Out   Hepatitis C Screening  Discontinued    Advanced directives: (In Chart) A copy of your advanced directives are scanned into your chart should your provider ever need it.  Next Medicare Annual Wellness Visit scheduled for next year: Yes

## 2023-01-19 NOTE — Progress Notes (Unsigned)
Subjective:   DAIRO HEMRIC is a 82 y.o. male who presents for Medicare Annual/Subsequent preventive examination.  Visit Complete: {VISITMETHOD:[phone]}  Patient Medicare AWV questionnaire was completed by the patient on ***; I have confirmed that all information answered by patient is correct and no changes since this date.  Review of Systems    ***       Objective:    There were no vitals filed for this visit. There is no height or weight on file to calculate BMI.     05/03/2022    9:38 AM 01/01/2022    4:26 PM 01/01/2022    4:03 PM 11/10/2020    3:51 PM 09/05/2020    2:56 PM 11/19/2019    8:50 AM 07/20/2018    3:44 PM  Advanced Directives  Does Patient Have a Medical Advance Directive? Yes  Yes No Yes Yes No  Type of Advance Directive Living will  Healthcare Power of [place];Living will  Healthcare Power of [place];Living will Living will   Does patient want to make changes to medical advance directive? No - Patient declined  No - Patient declined   Yes (ED - Information included in AVS)   Copy of Healthcare Power of Attorney in Chart?  Yes - validated most recent copy scanned in chart (See row information) No - copy requested  Yes - validated most recent copy scanned in chart (See row information)    Would patient like information on creating a medical advance directive? No - Patient declined   No - Patient declined   No - Patient declined    Current Medications (verified) Outpatient Encounter Medications as of 01/20/2023  Medication Sig   amLODipine (NORVASC) 10 MG tablet TAKE 1 TABLET BY MOUTH DAILY   aspirin 81 MG tablet Take 81 mg by mouth daily.   atorvastatin (LIPITOR) 40 MG tablet TAKE 1 TABLET BY MOUTH DAILY   calcitRIOL (ROCALTROL) 0.25 MCG capsule Take by mouth. Takes Monday, Wednesday, and Friday   cilostazol (PLETAL) 50 MG tablet Take 1 tablet (50 mg total) by mouth 2 (two) times daily. (Patient not taking: Reported on 01/01/2022)   latanoprost (XALATAN) 0.005  % ophthalmic solution Place 1 drop into both eyes at bedtime.   metoprolol succinate (TOPROL-XL) 25 MG 24 hr tablet TAKE 1 TABLET BY MOUTH ONCE  DAILY   triamcinolone ointment (KENALOG) 0.5 % Apply 1 Application topically 2 (two) times daily.   No facility-administered encounter medications on file as of 01/20/2023.    Allergies (verified) Patient has no known allergies.   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 Cherie Ouch at [facility], avoid nephrotoxic agents    Coronary atherosclerosis of artery bypass graft 10/20/2009   Qualifier: Diagnosis of  By: Omar Person     Decreased hearing 01/15/2013   ESOPHAGEAL ULCER, WITH BLEEDING 10/25/2008   Qualifier: Diagnosis of  By: Jeanice Lim MD, Conni Slipper 08/29/2009   Qualifier: Diagnosis of  By: Oswald Hillock   Statin therapy    HYPERTENSION, BENIGN SYSTEMIC 07/24/2006   Qualifier: Diagnosis of  By: Bebe Shaggy     SECONDARY HYPERPARATHYROIDISM  05/04/2007   Qualifier: Diagnosis of  By: [place], Bonnie     TOBACCO ABUSE 08/10/2007   Qualifier: Diagnosis of  By: Corliss Marcus MD, MAKEECHA     Transfusion history 2010   Unstable angina (HCC) 01/15/2013   Past Surgical History:  Procedure Laterality Date   CORONARY ARTERY BYPASS GRAFT     INGUINAL HERNIA REPAIR  2009   PROSTATECTOMY  2010   robotic   Family History  Problem Relation Age of Onset   Kidney disease Mother    Hypertension Brother    Hypertension Other    Prostate cancer Other        grandfather   Social History   Socioeconomic History   Marital status: Widowed    Spouse name: Not on file   Number of children:  4   Years of education: 39   Highest education level: 12th grade  Occupational History    Comment: truck driver - retired  Tobacco Use   Smoking status: Every Day    Current packs/day: 0.30    Average packs/day: 0.3 packs/day for 56.6 years (17.0 ttl pk-yrs)    Types: Pipe, Cigarettes    Start date: 05/1966    Passive exposure: Current   Smokeless tobacco: Never   Tobacco comments:    Pipe smoker  Vaping Use   Vaping status: Never Used  Substance and Sexual Activity   Alcohol use: Not Currently   Drug use: No   Sexual activity: Not Currently  Other Topics Concern   Not on file  Social History Narrative   Patient lives alone in [place].    Patients daughter Eulogio Ditch lives in the area.    Patient does not drive and she is his form of transportation.    Eulogio Ditch helps organize his medications and helps with grocery/pharmacy shopping needs.    Patient walks ~ 10 minutes each day.   Social Determinants of Health   Financial Resource Strain: Low Risk  (01/01/2022)   Overall Financial Resource Strain (CARDIA)    Difficulty of Paying Living Expenses: Not hard at all  Food Insecurity: No Food Insecurity (01/01/2022)   Hunger Vital Sign    Worried About Running Out of Food in the Last Year: Never true    Ran Out of Food in the Last Year: Never true  Transportation Needs: No Transportation Needs (01/01/2022)   PRAPARE - Administrator, Civil Service (Medical): No    Lack of Transportation (Non-Medical): No  Physical Activity: Insufficiently Active (01/01/2022)   Exercise Vital Sign    Days of Exercise per Week: 7 days    Minutes of Exercise per Session: 10 min  Stress: No Stress Concern Present (01/01/2022)   [employer] of Occupational Health - Occupational Stress Questionnaire    Feeling of Stress : Not at all  Social Connections: Moderately Isolated (01/01/2022)   Social Connection and Isolation Panel [NHANES]    Frequency of Communication with Friends and Family:  More than three times a week    Frequency of Social Gatherings with Friends and Family: More than three times a week    Attends Religious Services: More than 4 times per year    Active Member of [employer] or Organizations: No    Attends Banker Meetings: Never    Marital Status: Widowed    Tobacco Counseling Ready to quit: Not Answered Counseling given: Not Answered Tobacco comments: Pipe smoker   Clinical Intake:  Activities of Daily Living     No data to display          Patient Care Team: Celine Mans, MD as PCP - General (Family Medicine) Terrial Rhodes, MD as Consulting Physician (Nephrology) [facility] Laurey Morale, MD as Consulting Physician (Cardiology)  Indicate any recent Medical Services you may have received from other than [facility] providers in the past year (date may be approximate).     Assessment:   This is a routine wellness examination for [place].  Hearing/Vision screen No results found.  Dietary issues and exercise activities discussed:     Goals Addressed   None    Depression Screen    05/03/2022    9:38 AM 01/01/2022    4:00 PM 11/13/2021    3:37 PM 11/10/2020    3:51 PM 09/05/2020    2:57 PM 11/19/2019    8:51 AM 07/20/2018    3:45 PM  PHQ 2/9 Scores  PHQ - 2 Score 3 0 0 1 0 0 0  PHQ- 9 Score 8 3 3 1        Fall Risk    05/03/2022    9:39 AM 01/01/2022    4:26 PM 11/10/2020    3:51 PM 09/05/2020    2:56 PM 11/19/2019    8:51 AM  Fall Risk   Falls in the past year? 0 0 0 0 0  Number falls in past yr: 0 0 0  0  Injury with Fall? 0 0   0  Risk for fall due to :  Impaired balance/gait;Impaired mobility     Follow up  Falls prevention discussed  Falls prevention discussed     MEDICARE RISK AT HOME:    TIMED UP AND GO:  Was the test performed?  No    Cognitive Function:    07/20/2018    3:47 PM  MMSE - Mini Mental State Exam  Orientation to time 5   Orientation to Place 5  Registration 3  Attention/ Calculation 5  Recall 3  Language- name 2 objects 2  Language- repeat 1  Language- follow 3 step command 3  Language- read & follow direction 1  Write a sentence 1  Copy design 1  Total score 30        01/01/2022    4:26 PM 09/05/2020    2:58 PM 07/20/2018    3:48 PM  6CIT Screen  What Year? 0 points 0 points 0 points  What month? 0 points 0 points 0 points  What time? 0 points 0 points 0 points  Count back from 20 2 points 0 points 0 points  Months in reverse 2 points 0 points 0 points  Repeat phrase 0 points 0 points 0 points  Total Score 4 points 0 points 0 points    Immunizations Immunization History  Administered Date(s) Administered   COVID-19, mRNA, vaccine(Comirnaty)12 years and older 05/03/2022   Fluad Quad(high Dose 65+) 06/08/2020, 05/03/2022   Influenza Split 03/18/2011   Influenza Whole 03/03/2008   Influenza,inj,Quad PF,6+ Mos 04/26/2013, 04/03/2015, 03/04/2017, 01/20/2018, 02/23/2019   Influenza-Unspecified 03/18/2016   PFIZER(Purple Top)SARS-COV-2 Vaccination 07/18/2019, 08/11/2019, 03/10/2020, 09/05/2020   Pneumococcal Conjugate-13 04/03/2015   Pneumococcal Polysaccharide-23 03/03/2008   Td 07/02/2004    TDAP status: Due, Education has been provided regarding the importance of this vaccine. Advised may receive this vaccine at local pharmacy or Health Dept. Aware to provide a copy of the vaccination record if obtained from local pharmacy or Health Dept.  Verbalized acceptance and understanding.  Flu Vaccine status: Due, Education has been provided regarding the importance of this vaccine. Advised may receive this vaccine at local pharmacy or Health Dept. Aware to provide a copy of the vaccination record if obtained from local pharmacy or Health Dept. Verbalized acceptance and understanding.  Pneumococcal vaccine status: Up to date  Covid-19 vaccine status: Information provided on how to obtain vaccines.    Qualifies for Shingles Vaccine? Yes   Zostavax completed No   Shingrix Completed?: No.    Education has been provided regarding the importance of this vaccine. Patient has been advised to call insurance company to determine out of pocket expense if they have not yet received this vaccine. Advised may also receive vaccine at local pharmacy or Health Dept. Verbalized acceptance and understanding.  Screening Tests Health Maintenance  Topic Date Due   Zoster Vaccines- Shingrix (1 of 2) Never done   DTaP/Tdap/Td (2 - Tdap) 07/02/2014   COVID-19 Vaccine (6 - 2023-24 season) 09/02/2022   Medicare Annual Wellness (AWV)  01/02/2023   INFLUENZA VACCINE  12/26/2022   Pneumonia Vaccine 76+ Years old  Completed   HPV VACCINES  Aged Out   Hepatitis C Screening  Discontinued    Health Maintenance  Health Maintenance Due  Topic Date Due   Zoster Vaccines- Shingrix (1 of 2) Never done   DTaP/Tdap/Td (2 - Tdap) 07/02/2014   COVID-19 Vaccine (6 - 2023-24 season) 09/02/2022   Medicare Annual Wellness (AWV)  01/02/2023   INFLUENZA VACCINE  12/26/2022    Colorectal cancer screening: No longer required.   Lung Cancer Screening: (Low Dose CT Chest recommended if Age 73-80 years, 20 pack-year currently smoking OR have quit w/in 15years.) does not qualify.   Lung Cancer Screening Referral: n/a  Additional Screening:  Hepatitis C Screening: does qualify; Completed 11/19/19  Vision Screening: Recommended annual ophthalmology exams for early detection of glaucoma and other disorders of the eye. Is the patient up to date with their annual eye exam?  Yes  Who is the provider or what is the name of the office in which the patient attends annual eye exams? [facility] If pt is not established with a provider, would they like to be referred to a provider to establish care? No .   Dental Screening: Recommended annual dental exams for proper oral hygiene  Community Resource Referral / Chronic Care  Management: CRR required this visit?  {YES/NO:21197}  CCM required this visit?  {CCM Required choices:[phone]}     Plan:     I have personally reviewed and noted the following in the patient's chart:   Medical and social history Use of alcohol, tobacco or illicit drugs  Current medications and supplements including opioid prescriptions. {Opioid Prescriptions:[phone]} Functional ability and status Nutritional status Physical activity Advanced directives List of other physicians Hospitalizations, surgeries, and ER visits in previous 12 months Vitals Screenings to include cognitive, depression, and falls Referrals and appointments  In addition, I have reviewed and discussed with patient certain preventive protocols, quality metrics, and best practice recommendations. A written personalized care plan for preventive services as well as general preventive health recommendations were provided to patient.     Kandis Fantasia [place], [place]   1/61/0960   After Visit Summary: {CHL AMB AWV After Visit [phone]}  Nurse Notes: ***

## 2023-01-20 ENCOUNTER — Ambulatory Visit (INDEPENDENT_AMBULATORY_CARE_PROVIDER_SITE_OTHER): Payer: Medicare Other

## 2023-01-20 VITALS — Ht 69.0 in | Wt 144.0 lb

## 2023-01-20 DIAGNOSIS — Z0000 Encounter for general adult medical examination without abnormal findings: Secondary | ICD-10-CM | POA: Diagnosis not present

## 2023-01-28 ENCOUNTER — Ambulatory Visit: Payer: Medicare Other | Admitting: Family Medicine

## 2023-01-30 ENCOUNTER — Other Ambulatory Visit: Payer: Self-pay

## 2023-01-30 DIAGNOSIS — E7849 Other hyperlipidemia: Secondary | ICD-10-CM

## 2023-01-31 MED ORDER — ATORVASTATIN CALCIUM 40 MG PO TABS: 40.0000 mg | ORAL_TABLET | Freq: Every day | ORAL | 2 refills | Status: DC

## 2023-03-10 ENCOUNTER — Ambulatory Visit: Payer: Medicare Other | Admitting: Family Medicine

## 2023-03-10 ENCOUNTER — Encounter: Payer: Self-pay | Admitting: Family Medicine

## 2023-03-10 VITALS — BP 110/77 | HR 67 | Ht 69.0 in | Wt 142.8 lb

## 2023-03-10 DIAGNOSIS — I739 Peripheral vascular disease, unspecified: Secondary | ICD-10-CM

## 2023-03-10 DIAGNOSIS — Z23 Encounter for immunization: Secondary | ICD-10-CM | POA: Diagnosis not present

## 2023-03-10 DIAGNOSIS — E7849 Other hyperlipidemia: Secondary | ICD-10-CM | POA: Diagnosis not present

## 2023-03-10 DIAGNOSIS — I10 Essential (primary) hypertension: Secondary | ICD-10-CM

## 2023-03-10 MED ORDER — ATORVASTATIN CALCIUM 40 MG PO TABS: 40.0000 mg | ORAL_TABLET | Freq: Every day | ORAL | 2 refills | Status: DC

## 2023-03-10 MED ORDER — AMLODIPINE BESYLATE 10 MG PO TABS: 10.0000 mg | ORAL_TABLET | Freq: Every day | ORAL | 1 refills | Status: DC

## 2023-03-10 NOTE — Assessment & Plan Note (Signed)
Hold cilostazol.  Patient with reportedly reduced EF several years ago, and is contraindicated in patients with heart failure.  Recommend discussing continued use with cardiology before restarting.  Patient agreeable to plan. -Cardiology referral placed

## 2023-03-10 NOTE — Assessment & Plan Note (Signed)
Refilled atorvastatin, will check lipid panel at next visit.

## 2023-03-10 NOTE — Progress Notes (Signed)
    SUBJECTIVE:   CHIEF COMPLAINT / HPI: med check up  Med Rec -Amlodipine 10 mg daily, requesting refill -Aspirin 81 mg daily -Lipitor 40 mg daily, requesting refill -Calcitriol -Cilostazol 50 mg twice daily, requesting refill -Metoprolol XL 25 mg daily  Per Cardiology note from 2017. EF 50%. Does have some leg pain with significant walking. No new changes in his leg pain. Has been taking cilostazol for over 2 years.  PERTINENT  PMH / PSH: HTN, PAD, CKD3  OBJECTIVE:   BP 110/77   Pulse 67   Ht 5\' 9"  (1.753 m)   Wt 142 lb 12.8 oz (64.8 kg)   SpO2 99%   BMI 21.09 kg/m   General: NAD  Neuro: A&O Cardiovascular: RRR, no murmurs, no peripheral edema, 1+ DP pulses Respiratory: normal WOB on RA, CTAB, no wheezes, ronchi or rales Extremities: Moving all 4 extremities equally   ASSESSMENT/PLAN:   Assessment & Plan PAD (peripheral artery disease) (HCC) Hold cilostazol.  Patient with reportedly reduced EF several years ago, and is contraindicated in patients with heart failure.  Recommend discussing continued use with cardiology before restarting.  Patient agreeable to plan. -Cardiology referral placed Other hyperlipidemia Refilled atorvastatin, will check lipid panel at next visit. Essential hypertension Blood pressure controlled today, continue amlodipine 10 mg daily and metoprolol 25 mg extended release. Encounter for immunization Flu and COVID-vaccine administered today.  Return in about 2 months (around 05/10/2023).  After visit with patient's nephrologist.  Celine Mans, MD [facility]

## 2023-03-10 NOTE — Patient Instructions (Addendum)
It was great to see you! Thank you for allowing me to participate in your care!  Our plans for today:  -Please stop taking cilostazol until you follow-up with cardiology. -Please let me know if you need medication refills -Please make an appointment to see me after your next visit with your kidney doctor.   Please arrive 15 minutes PRIOR to your next scheduled appointment time! If you do not, this affects OTHER patients' care.  Take care and seek immediate care sooner if you develop any concerns.   Celine Mans, MD, PGY-2 [facility] 4:20 PM 03/10/2023  [facility]

## 2023-03-10 NOTE — Assessment & Plan Note (Signed)
Blood pressure controlled today, continue amlodipine 10 mg daily and metoprolol 25 mg extended release.

## 2023-05-16 ENCOUNTER — Ambulatory Visit: Payer: Medicare Other | Admitting: Cardiology

## 2023-07-25 ENCOUNTER — Encounter (HOSPITAL_BASED_OUTPATIENT_CLINIC_OR_DEPARTMENT_OTHER): Payer: Self-pay | Admitting: Cardiology

## 2023-07-25 ENCOUNTER — Ambulatory Visit (HOSPITAL_BASED_OUTPATIENT_CLINIC_OR_DEPARTMENT_OTHER): Payer: Medicare Other | Admitting: Cardiology

## 2023-07-25 VITALS — BP 100/62 | HR 73 | Ht 69.0 in | Wt 134.6 lb

## 2023-07-25 DIAGNOSIS — R5382 Chronic fatigue, unspecified: Secondary | ICD-10-CM | POA: Diagnosis not present

## 2023-07-25 DIAGNOSIS — I10 Essential (primary) hypertension: Secondary | ICD-10-CM | POA: Diagnosis not present

## 2023-07-25 DIAGNOSIS — I739 Peripheral vascular disease, unspecified: Secondary | ICD-10-CM

## 2023-07-25 DIAGNOSIS — I5042 Chronic combined systolic (congestive) and diastolic (congestive) heart failure: Secondary | ICD-10-CM

## 2023-07-25 DIAGNOSIS — I519 Heart disease, unspecified: Secondary | ICD-10-CM

## 2023-07-25 DIAGNOSIS — N189 Chronic kidney disease, unspecified: Secondary | ICD-10-CM

## 2023-07-25 NOTE — Patient Instructions (Signed)
 Medication Instructions:  Your physician recommends that you continue on your current medications as directed. Please refer to the Current Medication list given to you today.   *If you need a refill on your cardiac medications before your next appointment, please call your pharmacy*  Testing/Procedures: Your physician has requested that you have an echocardiogram. Echocardiography is a painless test that uses sound waves to create images of your heart. It provides your doctor with information about the size and shape of your heart and how well your heart's chambers and valves are working. This procedure takes approximately one hour. There are no restrictions for this procedure. Please do NOT wear cologne, perfume, aftershave, or lotions (deodorant is allowed). Please arrive 15 minutes prior to your appointment time.  Please note: We ask at that you not bring children with you during ultrasound (echo/ vascular) testing. Due to room size and safety concerns, children are not allowed in the ultrasound rooms during exams. Our front office staff cannot provide observation of children in our lobby area while testing is being conducted. An adult accompanying a patient to their appointment will only be allowed in the ultrasound room at the discretion of the ultrasound technician under special circumstances. We apologize for any inconvenience.    Follow-Up: At [facility], you and your health needs are our priority.  As part of our continuing mission to provide you with exceptional heart care, we have created designated Provider Care Teams.  These Care Teams include your primary Cardiologist (physician) and Advanced Practice Providers (APPs -  Physician Assistants and Nurse Practitioners) who all work together to provide you with the care you need, when you need it.  We recommend signing up for the patient portal called "MyChart".  Sign up information is provided on this After Visit Summary.   MyChart is used to connect with patients for Virtual Visits (Telemedicine).  Patients are able to view lab/test results, encounter notes, upcoming appointments, etc.  Non-urgent messages can be sent to your provider as well.   To learn more about what you can do with MyChart, go to [email or web address].    Your next appointment:   To be determined per echo results  Provider:   Jodelle Red, MD

## 2023-07-25 NOTE — Progress Notes (Signed)
 Cardiology Office Note:  .   Date:  07/25/2023  ID:  [name], DOB 03-17-1941, MRN [id number] PCP: Celine Mans, MD  [facility] Providers Cardiologist:  Jodelle Red, MD {  History of Present Illness: Matthew [name]   Matthew [name] is a 83 y.o. male with PMH chronic kidney disease, hypertension, remote chest pain who is seen today as a new patient for the evaluation of cilostazol use.  Today: Reviewed referral from Dr. Crissie Reese. Pray in the family practice center from 03/10/23 for unstable angina by referral, though notes comment on concern re: cilostazol. He was initially scheduled to see Dr. Odis Hollingshead on 05/16/23 but cancelled that appointment. I do not see any ER visits since the time of his referral.  Note from day of referral recommended cardiology evaluation for discussion of cilostazol. He had been on cilostazol for several years.  He has previously been seen by advanced heart failure, Dr. McLean/Dr. Gala Romney, last seen by Robbie Lis in 2017. He also follows with Dr. Randie Heinz for his PAD, last seen 01/28/2020. He was recommended to continue aspirin, statin, cilostazol at that time.  Despite being previously seen by heart failure, he does not have a history of reduced EF that I can see. Echo 2011 with EF 60%, nuclear stress 2011 EF 57% with decreased uptake in inferior wall. Nuclear stress 2014 EF 50% with fixed inferior defect without ischemia.  Here with family member. Overall he feels well except for no energy. No SOB, edema, PND, orthopnea.   He has difficulty walking without a walker. Denies any claudication symptoms. No nonhealing wounds, no rest pain.   He denies any chest pain or chest heaviness.  ROS: Denies chest pain, shortness of breath at rest or with normal exertion. No PND, orthopnea, LE edema or unexpected weight gain. No syncope or palpitations. ROS otherwise negative except as noted.   Studies Reviewed: Matthew [name]    EKG:  EKG  Interpretation Date/Time:  Friday July 25 2023 14:49:02 EST Ventricular Rate:  76 PR Interval:  158 QRS Duration:  114 QT Interval:  400 QTC Calculation: 450 R Axis:   -68  Text Interpretation: Normal sinus rhythm Incomplete right bundle branch block Left anterior fascicular block Minimal voltage criteria for LVH, may be normal variant Confirmed by Jodelle Red [phone]) on 07/25/2023 3:28:44 PM    Physical Exam:   VS:  BP 100/62   Pulse 73   Ht 5\' 9"  (1.753 m)   Wt 134 lb 9.6 oz (61.1 kg)   SpO2 97%   BMI 19.88 kg/m    Wt Readings from Last 3 Encounters:  07/25/23 134 lb 9.6 oz (61.1 kg)  03/10/23 142 lb 12.8 oz (64.8 kg)  01/20/23 144 lb (65.3 kg)    GEN: Well nourished, well developed in no acute distress HEENT: Normal, moist mucous membranes NECK: No JVD CARDIAC: regular rhythm, normal S1 and S2, no rubs or gallops. No murmur. VASCULAR: Radial pulses 2+ bilaterally. Palpable but faint bilateral DP and PT pulses RESPIRATORY:  Clear to auscultation without rales, wheezing or rhonchi  ABDOMEN: Soft, non-tender, non-distended MUSCULOSKELETAL:  Ambulates independently with walker SKIN: Warm and dry, no edema NEUROLOGIC:  Alert and oriented x 3. No focal neuro deficits noted. PSYCHIATRIC:  Normal affect    ASSESSMENT AND PLAN: .    Fatigue -appears euvolemic. He does not have history of heart failure clinically; nuclear stress EF 50% in 2014 with borderline LV dysfunction, though these stress test EF are often inaccurate. -will check  echo, none recently  PAD: -reviewed prior ABI -recommended to stop cilostazol medication at visit in 02/2023, but still on med list. Daughter reviewed med list, confirms he is not taking this -no claudication, rest pain, or nonhealing wounds to suggest severe ischemia -he has palpable distal pulses -he does not have known heart failure as contraindication to cilostazol (echo ordered as above), though given that he has not had  symptoms off cilostazol, reasonable to remain off of it at this time -continue aspirin 81 mg daily, atorvastatin 40 mg daily  Hypertension CKD, unknown stage -borderline low today but asymptomatic. On amlodipine 10 mg daily and metoprolol succinate 25 mg daily Follows with Dr. Abel Presto at [facility] every 6 mos.  Dispo: to be determined based on results of testing (EF)  Signed, Jodelle Red, MD   Jodelle Red, MD, PhD, [facility]  [facility]  [place]  [facility] at [facility] at [facility] [address], [address] [place], [place] [address] [phone]

## 2023-07-27 ENCOUNTER — Encounter (HOSPITAL_BASED_OUTPATIENT_CLINIC_OR_DEPARTMENT_OTHER): Payer: Self-pay | Admitting: Cardiology

## 2023-08-14 ENCOUNTER — Other Ambulatory Visit: Payer: Self-pay | Admitting: Family Medicine

## 2023-08-14 DIAGNOSIS — I10 Essential (primary) hypertension: Secondary | ICD-10-CM

## 2023-08-15 ENCOUNTER — Ambulatory Visit (HOSPITAL_COMMUNITY): Payer: Medicare Other | Attending: Cardiology

## 2023-08-15 DIAGNOSIS — I110 Hypertensive heart disease with heart failure: Secondary | ICD-10-CM | POA: Diagnosis not present

## 2023-08-15 DIAGNOSIS — I082 Rheumatic disorders of both aortic and tricuspid valves: Secondary | ICD-10-CM | POA: Diagnosis not present

## 2023-08-15 DIAGNOSIS — I519 Heart disease, unspecified: Secondary | ICD-10-CM | POA: Diagnosis present

## 2023-08-15 DIAGNOSIS — I509 Heart failure, unspecified: Secondary | ICD-10-CM | POA: Diagnosis not present

## 2023-08-15 DIAGNOSIS — F17200 Nicotine dependence, unspecified, uncomplicated: Secondary | ICD-10-CM | POA: Insufficient documentation

## 2023-08-15 DIAGNOSIS — R5382 Chronic fatigue, unspecified: Secondary | ICD-10-CM | POA: Insufficient documentation

## 2023-08-15 DIAGNOSIS — E785 Hyperlipidemia, unspecified: Secondary | ICD-10-CM | POA: Diagnosis not present

## 2023-08-15 LAB — ECHOCARDIOGRAM COMPLETE
Area-P 1/2: 2.99 cm2
P 1/2 time: 669 ms
S' Lateral: 4.3 cm

## 2023-10-02 ENCOUNTER — Telehealth (HOSPITAL_BASED_OUTPATIENT_CLINIC_OR_DEPARTMENT_OTHER): Payer: Self-pay

## 2023-10-02 NOTE — Telephone Encounter (Signed)
-----   Message from [facility] sent at 10/02/2023 10:20 AM EDT ----- Please apologize for the delay, his results came back while I was out and I just saw them. His heart function is mildly reduced, which is not ideal for use of the cilostazol . At our visit, daughter reviewed his medication list and confirmed it was not on there, but just want to make sure. I'd like to bring him back to see how he is doing and talk about some medications options that might be good given his abnormal function.

## 2023-10-02 NOTE — Telephone Encounter (Signed)
 Called and left voice message to call back

## 2023-10-03 ENCOUNTER — Telehealth (HOSPITAL_BASED_OUTPATIENT_CLINIC_OR_DEPARTMENT_OTHER): Payer: Self-pay | Admitting: Cardiology

## 2023-10-03 NOTE — Telephone Encounter (Signed)
 Follow Up:    Daughter is returning call, concerning patient's results

## 2023-10-03 NOTE — Telephone Encounter (Signed)
 Called and spoke to pt's daughter. Discussed echo results and MD's recommendations. Pt's daughter verbalized understanding; pt scheduled to see Dr. Veryl Gottron in Aug 2025. Daughter refused for pt to be seen by APP.

## 2023-10-13 ENCOUNTER — Other Ambulatory Visit: Payer: Self-pay | Admitting: Family Medicine

## 2023-10-13 DIAGNOSIS — I10 Essential (primary) hypertension: Secondary | ICD-10-CM

## 2023-11-03 ENCOUNTER — Encounter: Payer: Self-pay | Admitting: Family Medicine

## 2023-11-03 ENCOUNTER — Ambulatory Visit: Admitting: Family Medicine

## 2023-11-03 VITALS — BP 96/58 | HR 106 | Wt 130.0 lb

## 2023-11-03 DIAGNOSIS — R32 Unspecified urinary incontinence: Secondary | ICD-10-CM | POA: Diagnosis not present

## 2023-11-03 DIAGNOSIS — R4189 Other symptoms and signs involving cognitive functions and awareness: Secondary | ICD-10-CM

## 2023-11-03 DIAGNOSIS — R5383 Other fatigue: Secondary | ICD-10-CM | POA: Diagnosis not present

## 2023-11-03 NOTE — Progress Notes (Addendum)
    SUBJECTIVE:   CHIEF COMPLAINT / HPI:  Accompanied by daughter who helps provide some history, perspective.  Fatigue Ongoing 1 month per pt. Denies dizziness, lightheadedness. No new changes to medications. Daughter is concerned he is not making it to the bathroom because he is too tired to get there. Last colonoscopy 2010. Denies rectal bleeding, dark/tarry stool.  Recent fall About 2 weeks ago. Typically ambulates with walker. Does not remember fall, but suddenly was on floor. No preceding symptoms that he can recall. Denies hit head, but was unwitnessed. Daughter called to check on him and he answered phone and said he needed help - estimates he was down 2 hours.  Weight Loss Denies decreased appetite, but then shares he is down to 1 meal a day (chicken, rice, broccoli, steak, lima beans, etc). Denies abdominal pain with eating. Struggles to qualify why his appetite is reduced - just says he only wants a little bit of food. Daughter has offered protein shakes and he has refused them.  Urinary Incontinence Daughter is concerned he is not making it to the bathroom and wetting himself.   Daughter desires dementia testing.   PERTINENT  PMH / PSH: Reviewed. HTN PAD CKD 3 HLD Tremor H/o prostate cx  OBJECTIVE:   BP (!) 96/58   Pulse (!) 106   Wt 130 lb (59 kg)   SpO2 99%   BMI 19.20 kg/m    General: Elderly-appearing gentleman, pleasant and conversational though at times slow to answer questions. No acute distress. HEENT: normocephalic, PERRLA, EOM grossly intact. MMM. Cardio: Regular rate, regular rhythm, no murmurs on exam. Pulm: Clear, no wheezing, no crackles. No increased work of breathing. Abdominal: bowel sounds present, soft, non-tender, non-distended. Extremities: no peripheral edema. Moves all extremities equally. Neuro: Alert and oriented x3, speech normal in content, no facial asymmetry. Psych: Alert, communicative, and  cooperative.   ASSESSMENT/PLAN:   Assessment & Plan Other fatigue Unclear etiology, though with low BP, weight loss, and history of GI bleed I am concerned for recurrent GI bleed vs possible GI malignancy. Last colonoscopy in 2010; should have had repeat 1 year later but never did follow up. Also has h/o prostate cancer with prostatectomy. - labs today to include CBC, CMP, A1c, TSH, PSA. - future referral to GI for colonoscopy as appropriate, will await results of labs first - will follow up in the next 1-2 weeks. Urinary incontinence, unspecified type Possibly related to fatigue as above, or decline in mentation. Denies urinary symptoms today, but will obtain UA to workup further. - UA, reflex to culture as indicated. Cognitive decline May be the cause of decreased oral intake leading to weight loss. - after excluding other causes, patient may benefit from cognitive testing in our Geriatrics Clinic - will schedule as appropriate.     Omar Bibber, DO [facility]

## 2023-11-03 NOTE — Patient Instructions (Signed)
 Dear Rollin Clock  Today we discussed the following concerns and plans:  Fatigue, weight loss, decreased appetite - we are getting labs tomorrow to get more information - once we get more information I will refer to GI for colonoscopy, and our Geriatric Clinic for cognitive testing  Low blood pressure - for now HOLD amlodipine   If you have any concerns, please call the clinic or schedule an appointment.  It was a pleasure to take care of you today. Be well!  Omar Bibber, DO [facility], PGY-1

## 2023-11-04 ENCOUNTER — Other Ambulatory Visit

## 2023-11-07 ENCOUNTER — Other Ambulatory Visit (INDEPENDENT_AMBULATORY_CARE_PROVIDER_SITE_OTHER)

## 2023-11-07 DIAGNOSIS — R5383 Other fatigue: Secondary | ICD-10-CM | POA: Diagnosis not present

## 2023-11-07 DIAGNOSIS — R32 Unspecified urinary incontinence: Secondary | ICD-10-CM

## 2023-11-07 LAB — POCT GLYCOSYLATED HEMOGLOBIN (HGB A1C): Hemoglobin A1C: 4.9 % (ref 4.0–5.6)

## 2023-11-07 NOTE — Addendum Note (Signed)
 Addended by: Angelita Kendall on: 11/07/2023 03:38 PM   Modules accepted: Orders

## 2023-11-10 ENCOUNTER — Telehealth: Payer: Self-pay

## 2023-11-10 ENCOUNTER — Ambulatory Visit: Payer: Self-pay | Admitting: Family Medicine

## 2023-11-10 NOTE — Telephone Encounter (Signed)
 Called patient's daughter to schedule a lab appointment to collect urine sample for patient.  Scheduled 11/11/2023 @ 4:15pm  Christ Courier, CMA

## 2023-11-10 NOTE — Telephone Encounter (Signed)
-----   Message from Omar Bibber sent at 11/10/2023  8:39 AM EDT ----- Please call pt's daughter and let her know that lab did not collect urine sample when pt was here. If they are able to make lab appt and return for urine sample, please assist with appt.  Thanks! Isa Manuel

## 2023-11-11 ENCOUNTER — Other Ambulatory Visit: Payer: Self-pay | Admitting: Family Medicine

## 2023-11-11 ENCOUNTER — Other Ambulatory Visit (INDEPENDENT_AMBULATORY_CARE_PROVIDER_SITE_OTHER)

## 2023-11-11 DIAGNOSIS — Z8546 Personal history of malignant neoplasm of prostate: Secondary | ICD-10-CM

## 2023-11-11 DIAGNOSIS — R5383 Other fatigue: Secondary | ICD-10-CM

## 2023-11-11 DIAGNOSIS — R32 Unspecified urinary incontinence: Secondary | ICD-10-CM

## 2023-11-11 LAB — POCT URINALYSIS DIP (MANUAL ENTRY)
Blood, UA: NEGATIVE
Glucose, UA: NEGATIVE mg/dL
Leukocytes, UA: NEGATIVE
Nitrite, UA: NEGATIVE
Protein Ur, POC: 100 mg/dL — AB
Spec Grav, UA: 1.025 (ref 1.010–1.025)
Urobilinogen, UA: 1 U/dL
pH, UA: 5.5 (ref 5.0–8.0)

## 2023-11-12 ENCOUNTER — Encounter: Payer: Self-pay | Admitting: Family Medicine

## 2023-11-12 LAB — COMPREHENSIVE METABOLIC PANEL WITH GFR
ALT: 14 IU/L (ref 0–44)
AST: 19 IU/L (ref 0–40)
Albumin: 4.1 g/dL (ref 3.7–4.7)
Alkaline Phosphatase: 143 IU/L — ABNORMAL HIGH (ref 44–121)
BUN/Creatinine Ratio: 11 (ref 10–24)
BUN: 14 mg/dL (ref 8–27)
Bilirubin Total: 1.4 mg/dL — ABNORMAL HIGH (ref 0.0–1.2)
CO2: 19 mmol/L — ABNORMAL LOW (ref 20–29)
Calcium: 9.2 mg/dL (ref 8.6–10.2)
Chloride: 99 mmol/L (ref 96–106)
Creatinine, Ser: 1.22 mg/dL (ref 0.76–1.27)
Globulin, Total: 2.2 g/dL (ref 1.5–4.5)
Glucose: 69 mg/dL — ABNORMAL LOW (ref 70–99)
Potassium: 3.8 mmol/L (ref 3.5–5.2)
Sodium: 135 mmol/L (ref 134–144)
Total Protein: 6.3 g/dL (ref 6.0–8.5)
eGFR: 59 mL/min/{1.73_m2} — ABNORMAL LOW (ref >59)

## 2023-11-12 LAB — PSA: Prostate Specific Ag, Serum: <0.1 ng/mL (ref 0.0–4.0)

## 2023-11-12 LAB — CBC WITH DIFFERENTIAL/PLATELET
Basophils Absolute: 0 10*3/uL (ref 0.0–0.2)
Basos: 0 %
EOS (ABSOLUTE): 0.1 10*3/uL (ref 0.0–0.4)
Eos: 1 %
Hematocrit: 39.1 % (ref 37.5–51.0)
Hemoglobin: 13.5 g/dL (ref 13.0–17.7)
Immature Grans (Abs): 0 10*3/uL (ref 0.0–0.1)
Immature Granulocytes: 0 %
Lymphocytes Absolute: 1.6 10*3/uL (ref 0.7–3.1)
Lymphs: 27 %
MCH: 38.7 pg — ABNORMAL HIGH (ref 26.6–33.0)
MCHC: 34.5 g/dL (ref 31.5–35.7)
MCV: 112 fL — ABNORMAL HIGH (ref 79–97)
Monocytes Absolute: 0.7 10*3/uL (ref 0.1–0.9)
Monocytes: 11 %
Neutrophils Absolute: 3.6 10*3/uL (ref 1.4–7.0)
Neutrophils: 61 %
Platelets: 241 10*3/uL (ref 150–450)
RBC: 3.49 x10E6/uL — ABNORMAL LOW (ref 4.14–5.80)
RDW: 14.3 % (ref 11.6–15.4)
WBC: 6 10*3/uL (ref 3.4–10.8)

## 2023-11-12 LAB — URINALYSIS, MICROSCOPIC ONLY
Bacteria, UA: NONE SEEN
Casts: NONE SEEN /LPF
WBC, UA: NONE SEEN /HPF (ref 0–5)

## 2023-11-12 LAB — TSH RFX ON ABNORMAL TO FREE T4: TSH: 2.34 u[IU]/mL (ref 0.450–4.500)

## 2023-11-13 LAB — TSH RFX ON ABNORMAL TO FREE T4

## 2023-11-13 LAB — CBC

## 2023-11-13 LAB — PSA

## 2023-11-13 LAB — COMPREHENSIVE METABOLIC PANEL WITH GFR

## 2023-11-13 NOTE — Telephone Encounter (Signed)
 Called patient's daughter, confirmed identity.  Discussed lab results; specifically that I do not see any outright causes for his recent fatigue.  He is not anemic, electrolytes and TSH in normal range.  Checked PSA due to history of prostate cancer, and this was normal.  Daughter is interested in cognitive testing due to concern for dementia-discussed that this could be done in our specialty geriatrics clinic here at this office.  She is interested in being scheduled for this.

## 2023-11-13 NOTE — Telephone Encounter (Signed)
-----   Message from Ivin Marrow sent at 11/11/2023  5:27 PM EDT ----- Genette Kent they sent me the labs for this guy cuz I am his PCP, but originally from your clinic visit on the 9th. Thanks!  Bambi Lever ----- Message ----- From: Angelita Kendall, CMA Sent: 11/11/2023   4:58 PM EDT To: Ivin Marrow, MD

## 2023-11-14 ENCOUNTER — Other Ambulatory Visit: Payer: Self-pay | Admitting: Family Medicine

## 2023-11-14 DIAGNOSIS — E7849 Other hyperlipidemia: Secondary | ICD-10-CM

## 2023-11-23 ENCOUNTER — Emergency Department (HOSPITAL_COMMUNITY)

## 2023-11-23 ENCOUNTER — Other Ambulatory Visit: Payer: Self-pay

## 2023-11-23 ENCOUNTER — Emergency Department (HOSPITAL_COMMUNITY)
Admission: EM | Admit: 2023-11-23 | Discharge: 2023-11-25 | Disposition: A | Discharge status: Skilled Nursing Facility | Attending: Emergency Medicine | Admitting: Emergency Medicine

## 2023-11-23 ENCOUNTER — Encounter (HOSPITAL_COMMUNITY): Payer: Self-pay | Admitting: Emergency Medicine

## 2023-11-23 DIAGNOSIS — R262 Difficulty in walking, not elsewhere classified: Secondary | ICD-10-CM | POA: Insufficient documentation

## 2023-11-23 DIAGNOSIS — Z7982 Long term (current) use of aspirin: Secondary | ICD-10-CM | POA: Diagnosis not present

## 2023-11-23 DIAGNOSIS — Z79899 Other long term (current) drug therapy: Secondary | ICD-10-CM | POA: Insufficient documentation

## 2023-11-23 DIAGNOSIS — N189 Chronic kidney disease, unspecified: Secondary | ICD-10-CM | POA: Insufficient documentation

## 2023-11-23 DIAGNOSIS — R531 Weakness: Secondary | ICD-10-CM | POA: Insufficient documentation

## 2023-11-23 DIAGNOSIS — W19XXXA Unspecified fall, initial encounter: Secondary | ICD-10-CM | POA: Insufficient documentation

## 2023-11-23 DIAGNOSIS — I129 Hypertensive chronic kidney disease with stage 1 through stage 4 chronic kidney disease, or unspecified chronic kidney disease: Secondary | ICD-10-CM | POA: Diagnosis not present

## 2023-11-23 LAB — COMPREHENSIVE METABOLIC PANEL WITH GFR
ALT: 12 U/L (ref 0–44)
AST: 20 U/L (ref 15–41)
Albumin: 3.8 g/dL (ref 3.5–5.0)
Alkaline Phosphatase: 115 U/L (ref 38–126)
Anion gap: 13 (ref 5–15)
BUN: 16 mg/dL (ref 8–23)
CO2: 19 mmol/L — ABNORMAL LOW (ref 22–32)
Calcium: 9.2 mg/dL (ref 8.9–10.3)
Chloride: 99 mmol/L (ref 98–111)
Creatinine, Ser: 1.33 mg/dL — ABNORMAL HIGH (ref 0.61–1.24)
GFR, Estimated: 53 mL/min — ABNORMAL LOW (ref >60)
Glucose, Bld: 87 mg/dL (ref 70–99)
Potassium: 3.9 mmol/L (ref 3.5–5.1)
Sodium: 131 mmol/L — ABNORMAL LOW (ref 135–145)
Total Bilirubin: 3.3 mg/dL — ABNORMAL HIGH (ref 0.0–1.2)
Total Protein: 6.9 g/dL (ref 6.5–8.1)

## 2023-11-23 LAB — CBC WITH DIFFERENTIAL/PLATELET
Abs Immature Granulocytes: 0.01 10*3/uL (ref 0.00–0.07)
Basophils Absolute: 0 10*3/uL (ref 0.0–0.1)
Basophils Relative: 0 %
Eosinophils Absolute: 0 10*3/uL (ref 0.0–0.5)
Eosinophils Relative: 0 %
HCT: 41.2 % (ref 39.0–52.0)
Hemoglobin: 14.4 g/dL (ref 13.0–17.0)
Immature Granulocytes: 0 %
Lymphocytes Relative: 20 %
Lymphs Abs: 1 10*3/uL (ref 0.7–4.0)
MCH: 38 pg — ABNORMAL HIGH (ref 26.0–34.0)
MCHC: 35 g/dL (ref 30.0–36.0)
MCV: 108.7 fL — ABNORMAL HIGH (ref 80.0–100.0)
Monocytes Absolute: 0.5 10*3/uL (ref 0.1–1.0)
Monocytes Relative: 9 %
Neutro Abs: 3.7 10*3/uL (ref 1.7–7.7)
Neutrophils Relative %: 71 %
Platelets: 223 10*3/uL (ref 150–400)
RBC: 3.79 MIL/uL — ABNORMAL LOW (ref 4.22–5.81)
RDW: 14.6 % (ref 11.5–15.5)
WBC: 5.3 10*3/uL (ref 4.0–10.5)
nRBC: 0 % (ref 0.0–0.2)

## 2023-11-23 LAB — CK: Total CK: 127 U/L (ref 49–397)

## 2023-11-23 MED ORDER — AMLODIPINE BESYLATE 5 MG PO TABS
10.0000 mg | ORAL_TABLET | Freq: Every day | ORAL | Status: DC
  Administered 2023-11-23 – 2023-11-25 (×3): 10 mg via ORAL
  Filled 2023-11-23 (×3): qty 2

## 2023-11-23 MED ORDER — LATANOPROST 0.005 % OP SOLN
1.0000 [drp] | Freq: Every day | OPHTHALMIC | Status: DC
  Administered 2023-11-23 – 2023-11-24 (×2): 1 [drp] via OPHTHALMIC
  Filled 2023-11-23 (×3): qty 2.5

## 2023-11-23 MED ORDER — ASPIRIN 81 MG PO TBEC
81.0000 mg | DELAYED_RELEASE_TABLET | Freq: Every day | ORAL | Status: DC
  Administered 2023-11-23 – 2023-11-25 (×3): 81 mg via ORAL
  Filled 2023-11-23 (×3): qty 1

## 2023-11-23 MED ORDER — SODIUM CHLORIDE 0.9 % IV BOLUS: 1000.0000 mL | Freq: Once | INTRAVENOUS | Status: DC

## 2023-11-23 MED ORDER — ATORVASTATIN CALCIUM 40 MG PO TABS
40.0000 mg | ORAL_TABLET | Freq: Every day | ORAL | Status: DC
  Administered 2023-11-23 – 2023-11-25 (×3): 40 mg via ORAL
  Filled 2023-11-23 (×3): qty 1

## 2023-11-23 MED ORDER — METOPROLOL SUCCINATE ER 25 MG PO TB24
25.0000 mg | ORAL_TABLET | Freq: Every day | ORAL | Status: DC
  Administered 2023-11-23 – 2023-11-25 (×3): 25 mg via ORAL
  Filled 2023-11-23 (×3): qty 1

## 2023-11-23 NOTE — ED Provider Triage Note (Signed)
 Emergency Medicine Provider Triage Evaluation Note  Matthew [name] , a 83 y.o. male  was evaluated in triage.  Pt complains of unsteady gait for the past 3 to 4 weeks.  According to the daughter at the bedside, he suffered a fall this morning around 2 AM, he was helped out back to the bed by his daughter.  Now patient is here as he did not get out of bed since, has not been himself. No blood thinners but does have a small bump tot he left side of his head.   Review of Systems  Positive: Head pain, unsteady gate Negative: Fever, loc  Physical Exam  BP (!) 158/108 (BP Location: Right Arm)   Pulse 100   Temp 97.7 F (36.5 C)   Resp 18   Ht 5' 9 (1.753 m)   Wt 59 kg   SpO2 99%   BMI 19.20 kg/m  Gen:   Awake, no distress   Resp:  Normal effort  MSK:   Moves extremities without difficulty  Other:    Medical Decision Making  Medically screening exam initiated at 3:14 PM.  Appropriate orders placed.  Matthew [name] was informed that the remainder of the evaluation will be completed by another provider, this initial triage assessment does not replace that evaluation, and the importance of remaining in the ED until their evaluation is complete.     Matthew Balsley, PA-C 11/23/23 1514

## 2023-11-23 NOTE — ED Provider Notes (Signed)
 [place] EMERGENCY DEPARTMENT AT [facility] Provider Note   CSN: [id number] Arrival date & time: 11/23/23  1449     Patient presents with: Fall and Weakness   Matthew [name] is a 83 y.o. male.   HPI Presents with his daughter who provides much of the history. In essence patient has had decline in strength, functionality, and cognition over the past weeks, possibly month.  He does have a history of CKD, hypertension, hyperlipidemia and PAD.  Over the past month symptoms have progressed, and today the patient had a fall, several hours ago.  After the fall he was unable to walk.  He notes generalized weakness, denies focality.  Daughter notes the patient has been confused beyond baseline also exhibited atypical behavior.  They have been working with his primary care team to arrange outpatient neurology testing.    Prior to Admission medications   Medication Sig Start Date End Date Taking? Authorizing Provider  amLODipine  (NORVASC ) 10 MG tablet TAKE 1 TABLET BY MOUTH DAILY 10/16/23   Alba Sharper, MD  aspirin 81 MG tablet Take 81 mg by mouth daily.    [provider]  atorvastatin  (LIPITOR) 40 MG tablet TAKE 1 TABLET BY MOUTH DAILY 11/17/23   Alba Sharper, MD  calcitRIOL (ROCALTROL) 0.25 MCG capsule Take by mouth. Takes Monday, Wednesday, and Friday 12/28/19   [provider]  latanoprost (XALATAN) 0.005 % ophthalmic solution Place 1 drop into both eyes at bedtime.    [provider]  metoprolol  succinate (TOPROL -XL) 25 MG 24 hr tablet TAKE 1 TABLET BY MOUTH ONCE  DAILY 05/22/22   Austin Ade, MD    Allergies: Patient has no known allergies.    Review of Systems  Updated Vital Signs BP (!) 146/87 (BP Location: Right Arm)   Pulse 98   Temp 98 F (36.7 C) (Oral)   Resp 20   Ht 5' 9 (1.753 m)   Wt 59 kg   SpO2 100%   BMI 19.20 kg/m   Physical Exam Vitals and nursing note reviewed.  Constitutional:      General: He is not in acute  distress.    Appearance: He is well-developed.     Comments: Deconditioned appearing elderly male awake and alert, no distress.  HENT:     Head: Normocephalic and atraumatic.   Eyes:     Conjunctiva/sclera: Conjunctivae normal.    Cardiovascular:     Rate and Rhythm: Normal rate and regular rhythm.  Pulmonary:     Effort: Pulmonary effort is normal. No respiratory distress.     Breath sounds: No stridor.  Abdominal:     General: There is no distension.   Skin:    General: Skin is warm and dry.   Neurological:     Mental Status: He is alert and oriented to person, place, and time.     Cranial Nerves: No cranial nerve deficit.     Motor: Atrophy present. No tremor.     (all labs ordered are listed, but only abnormal results are displayed) Labs Reviewed  CBC WITH DIFFERENTIAL/PLATELET - Abnormal; Notable for the following components:      Result Value   RBC 3.79 (*)    MCV 108.7 (*)    MCH 38.0 (*)    All other components within normal limits  COMPREHENSIVE METABOLIC PANEL WITH GFR - Abnormal; Notable for the following components:   Sodium 131 (*)    CO2 19 (*)    Creatinine, Ser 1.33 (*)  Total Bilirubin 3.3 (*)    GFR, Estimated 53 (*)    All other components within normal limits  CK  URINALYSIS, ROUTINE W REFLEX MICROSCOPIC  TSH  MAGNESIUM    EKG: EKG Interpretation Date/Time:  Sunday November 23 2023 15:16:37 EDT Ventricular Rate:  96 PR Interval:  154 QRS Duration:  98 QT Interval:  386 QTC Calculation: 487 R Axis:   -71  Text Interpretation: Poor data quality will repeat Confirmed by Garrick Charleston [phone]) on 11/23/2023 3:20:26 PM  Radiology: MR BRAIN WO CONTRAST Result Date: 11/23/2023 CLINICAL DATA:  Neuro deficit, acute, stroke suspected weakness in LE, new. EXAM: MRI HEAD WITHOUT CONTRAST TECHNIQUE: Multiplanar, multiecho pulse sequences of the brain and surrounding structures were obtained without intravenous contrast. COMPARISON:  CT head November 03, 2014. FINDINGS: Motion limited study. Brain: No acute infarction, hemorrhage, hydrocephalus, extra-axial collection or mass lesion. Advanced patchy T2/FLAIR hyperintensities in the white matter, compatible with chronic microvascular ischemic disease. Remote left PCA territory infarct. Cerebral atrophy. Vascular: Major arterial flow voids are maintained Skull and upper cervical spine: Normal marrow signal. Sinuses/Orbits: Negative. IMPRESSION: 1. No evidence of acute intracranial abnormality. 2. 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 3.  Cerebral Atrophy (ICD10-G31.9). Electronically Signed   By: Gilmore GORMAN Molt M.D.   On: 11/23/2023 20:38   DG Chest 2 View Result Date: 11/23/2023 CLINICAL DATA:  R lower lat chest pain EXAM: CHEST - 2 VIEW COMPARISON:  07/15/2009 FINDINGS: Cluster of small dense probably calcified nodules laterally in the right upper lung. Lungs otherwise clear. No pneumothorax. Heart size and mediastinal contours are within normal limits. Tortuous thoracic aorta. No effusion. Visualized bones unremarkable. IMPRESSION: 1. No acute cardiopulmonary disease. 2. Small right upper lobe pulmonary granulomas. Electronically Signed   By: JONETTA Faes M.D.   On: 11/23/2023 16:56     Procedures   Medications Ordered in the ED  sodium chloride 0.9 % bolus 1,000 mL (has no administration in time range)                                    Medical Decision Making Elderly male presents with concern for fall, new inability to walk secondary to weakness in the context of worsening overall condition.  Broad differential including acute on chronic weakness, electrolyte abnormalities, stroke given his elevated risk profile with tobacco abuse, hyperlipidemia, hypertension and diabetes.  No chest pain, no arrhythmia on monitor Cardiac 100 sinus borderline Pulse ox 99% room air normal  Amount and/or Complexity of Data Reviewed Independent Historian:      Details: Daughter at bedside External Data Reviewed: notes.    Details: Primary care notes Labs: ordered. Decision-making details documented in ED Course. Radiology: ordered and independent interpretation performed. Decision-making details documented in ED Course. ECG/medicine tests: ordered and independent interpretation performed. Decision-making details documented in ED Course.   9:41 PM Patient remains hemodynamically about the same, evidence for mild dehydration, no evidence for stroke on MRI, no evidence for pneumonia on x-ray.  Discussed all findings, patient continues to complain of weakness in his legs, particular with attempts at ambulation.  Given this, his decline recently discussed options with family including discharge with home health PT nursing, versus social work with PT consult in the morning with consideration of resources possibly including placement but also possibly including return home with additional resources. Family is a strong preference for this latter option  and this was facilitated.      Final diagnoses:  Fall, initial encounter  Weakness     Garrick Charleston, MD 11/23/23 2141

## 2023-11-23 NOTE — ED Notes (Signed)
 Patient attempted to provide urine sample, attempt unsuccessful. Two person assist to standing, patient unable to stand for long periods of time.

## 2023-11-23 NOTE — ED Triage Notes (Signed)
 Patient arrives in wheelchair by POV with daughter stating patient has been having falls over the past 3-4 weeks. States he is shuffling and dragging both legs when walking. Patient states he has been having trouble walking. Patient walks with walker at baseline. Patient lives alone.

## 2023-11-24 ENCOUNTER — Encounter: Payer: Self-pay | Admitting: Family Medicine

## 2023-11-24 NOTE — ED Notes (Signed)
 Patient provided with warm blanket per request.  No complaints voiced at this time

## 2023-11-24 NOTE — ED Notes (Signed)
 Patient's daughter wants update when finding placement  Frankey Lesches- [phone]

## 2023-11-24 NOTE — Evaluation (Signed)
 Physical Therapy Evaluation Patient Details Name: Matthew [name] MRN: [id number] DOB: 03-17-1941 Today's Date: 11/24/2023  History of Present Illness  Patient is 83 y.o. male presenting to ED 11/23/23 after a fall at home. Pt has had gradual decline in strength and mobility over last couple weeks and unable to walk after fall. Daughter also reports increased confusion. ED workup indicates evidence for mild dehydration, no evidence for stroke on MRI, no evidence for pneumonia on x-ray. PMH significant for prostate cancer, CKD stage III, CABG in 2008, HLD, HTN, PAD, unstable angina.   Clinical Impression  Matthew [name] is 83 y.o. male admitted with above HPI and diagnosis. Patient is currently limited by functional impairments below (see PT problem list). Patient lives alone and is mod ind with rolaltor for home and limited community mobility at baseline. Currently pt requires min assist for bed mob and transfers with RW, Mod assist for gait and pt fatiguing quickly. Gait pattern impaired with low step clearance and short shuffling steps and some festination at transitions such as doorways and turns. Patient will benefit from continued skilled PT interventions to address impairments and progress independence with mobility. Patient will benefit from continued inpatient follow up therapy, <3 hours/day. Acute PT will follow and progress as able.         If plan is discharge home, recommend the following: A lot of help with walking and/or transfers;A lot of help with bathing/dressing/bathroom;Assistance with cooking/housework;Direct supervision/assist for medications management;Assist for transportation;Help with stairs or ramp for entrance   Can travel by private vehicle   Yes    Equipment Recommendations Rolling walker (2 wheels) (defer to next venue)  Recommendations for Other Services       Functional Status Assessment Patient has had a recent decline in their functional status and  demonstrates the ability to make significant improvements in function in a reasonable and predictable amount of time.     Precautions / Restrictions Precautions Precautions: Fall Recall of Precautions/Restrictions: Intact Restrictions Weight Bearing Restrictions Per Provider Order: No      Mobility  Bed Mobility Overal bed mobility: Needs Assistance Bed Mobility: Supine to Sit, Sit to Supine     Supine to sit: Min assist, HOB elevated, Used rails Sit to supine: Mod assist   General bed mobility comments: min assist to bring feet off EOB, movements slightly stiff. assist to control lowering trunk and raising LE's back onto bed.    Transfers Overall transfer level: Needs assistance Equipment used: Rolling walker (2 wheels) Transfers: Sit to/from Stand Sit to Stand: Min assist           General transfer comment: min assist with slight posterior lean on stand and blocking of LE's to prevent anterio slide. pt able to step feet back udnerneath self once standing.    Ambulation/Gait Ambulation/Gait assistance: Mod assist Gait Distance (Feet): 25 Feet Assistive device: Rolling walker (2 wheels) Gait Pattern/deviations: Step-through pattern, Decreased stride length, Shuffle, Decreased step length - right, Decreased step length - left, Decreased dorsiflexion - right, Decreased dorsiflexion - left, Festinating, Drifts right/left, Trunk flexed, Narrow base of support Gait velocity: decr     General Gait Details: gait mildly festinating and low shuffled parkinsons like steps. pt with flexed trunk and knees flexing as he fatigued.  Stairs            Wheelchair Mobility     Tilt Bed    Modified Rankin (Stroke Patients Only)       Balance Overall balance  assessment: Needs assistance Sitting-balance support: Feet supported Sitting balance-Leahy Scale: Fair Sitting balance - Comments: assist at start to shift trunk anteriorly for balance Postural control: Posterior  lean Standing balance support: During functional activity, Bilateral upper extremity supported, Reliant on assistive device for balance Standing balance-Leahy Scale: Poor                               Pertinent Vitals/Pain Pain Assessment Pain Assessment: No/denies pain    Home Living Family/patient expects to be discharged to:: Private residence Living Arrangements: Alone Available Help at Discharge: Available PRN/intermittently (lives alone, unsure how often daughter available to assist) Type of Home: Apartment Home Access: Elevator (5th floor apt.)       Home Layout: One level Home Equipment: Rollator (4 wheels);Cane - single point Additional Comments: use 4WW to ambulate in and out of home. pt regularly walks with rollator to elevator out to parking lot to smoke.    Prior Function Prior Level of Function : Independent/Modified Independent                     Extremity/Trunk Assessment   Upper Extremity Assessment Upper Extremity Assessment: Defer to OT evaluation;Generalized weakness    Lower Extremity Assessment Lower Extremity Assessment: Generalized weakness    Cervical / Trunk Assessment Cervical / Trunk Assessment: Kyphotic  Communication        Cognition Arousal: Alert Behavior During Therapy: WFL for tasks assessed/performed   PT - Cognitive impairments: No family/caregiver present to determine baseline, Safety/Judgement                       PT - Cognition Comments: pt oriented to self, place, and situation. states Monday, June, and 2005, on second attemp 2025. pt with reduced safety awareness stating he needs to sit when not in safe place to do so. Following commands: Intact       Cueing Cueing Techniques: Verbal cues     General Comments      Exercises     Assessment/Plan    PT Assessment Patient needs continued PT services  PT Problem List Decreased strength;Decreased range of motion;Decreased activity  tolerance;Decreased balance;Decreased mobility;Decreased coordination;Decreased cognition;Decreased knowledge of use of DME;Decreased safety awareness;Decreased knowledge of precautions;Cardiopulmonary status limiting activity       PT Treatment Interventions DME instruction;Gait training;Stair training;Functional mobility training;Therapeutic activities;Therapeutic exercise;Balance training;Neuromuscular re-education;Cognitive remediation;Patient/family education    PT Goals (Current goals can be found in the Care Plan section)  Acute Rehab PT Goals Patient Stated Goal: stop falling PT Goal Formulation: With patient Time For Goal Achievement: 04/16/24 Potential to Achieve Goals: Fair    Frequency Min 2X/week     Co-evaluation               AM-PAC PT 6 Clicks Mobility  Outcome Measure Help needed turning from your back to your side while in a flat bed without using bedrails?: A Little Help needed moving from lying on your back to sitting on the side of a flat bed without using bedrails?: A Little Help needed moving to and from a bed to a chair (including a wheelchair)?: A Little Help needed standing up from a chair using your arms (e.g., wheelchair or bedside chair)?: A Little Help needed to walk in hospital room?: A Lot Help needed climbing 3-5 steps with a railing? : Total 6 Click Score: 15    End of Session Equipment  Utilized During Treatment: Gait belt Activity Tolerance: Patient tolerated treatment well Patient left: in bed;with call bell/phone within reach Nurse Communication: Mobility status PT Visit Diagnosis: Other abnormalities of gait and mobility (R26.89);Unsteadiness on feet (R26.81);Muscle weakness (generalized) (M62.81);History of falling (Z91.81);Difficulty in walking, not elsewhere classified (R26.2);Other symptoms and signs involving the nervous system (R29.898)    Time: 9193-9166 PT Time Calculation (min) (ACUTE ONLY): 27 min   Charges:   PT  Evaluation $PT Eval Moderate Complexity: 1 Mod PT Treatments $Gait Training: 8-22 mins PT General Charges $$ ACUTE PT VISIT: 1 Visit         Vernell DONEEN KLEIN, DPT Acute Rehabilitation Services Office [phone]  11/24/23 10:07 AM

## 2023-11-24 NOTE — ED Notes (Signed)
Patient resting quietly.  Respirations even and unlabored.  ?

## 2023-11-24 NOTE — ED Notes (Signed)
Patient remains asleep. Respirations even and unlabored.

## 2023-11-24 NOTE — Discharge Planning (Signed)
 RNCM consulted regarding safe discharge planning (Home with Home Health vs Skilled Nursing Facility Placement).  Physical Therapy evaluation placed; will follow up after recommendations from PT.     Desirea Mizrahi J. Rachel Budds, BSN, RN, NCM  Transitions of Care  Nurse Case Manager  [facility] Emergency Departments  Operative Services  [phone]

## 2023-11-24 NOTE — ED Notes (Signed)
 Pt consuming his meal tray, call bell within reach, no concerns at this time.

## 2023-11-24 NOTE — ED Notes (Addendum)
 Family updated as to patient's status.  Pts daughter Yong Lesches- [phone]) requesting an update from PT and SW when available on tomorrow.

## 2023-11-25 LAB — URINALYSIS, ROUTINE W REFLEX MICROSCOPIC
Bacteria, UA: NONE SEEN
Bilirubin Urine: NEGATIVE
Glucose, UA: NEGATIVE mg/dL
Hgb urine dipstick: NEGATIVE
Ketones, ur: NEGATIVE mg/dL
Leukocytes,Ua: NEGATIVE
Nitrite: NEGATIVE
Protein, ur: 30 mg/dL — AB
Specific Gravity, Urine: 1.023 (ref 1.005–1.030)
pH: 6 (ref 5.0–8.0)

## 2023-11-25 NOTE — ED Notes (Signed)
 Brief & linen changed. Call bell within reach.

## 2023-11-25 NOTE — ED Notes (Signed)
 [facility] arrived for transport. Care handoff attempted to [facility].

## 2023-11-25 NOTE — ED Provider Notes (Signed)
 Emergency Medicine Observation Re-evaluation Note  Matthew [name] is a 83 y.o. male, seen on rounds today.  Pt initially presented to the ED for complaints of Fall and Weakness Currently, the patient is in his room, he has had breakfast, has no complaints at this time.SABRA  Physical Exam  BP 135/86   Pulse 74   Temp (!) 97.2 F (36.2 C) (Oral)   Resp (!) 7   Ht 5' 9 (1.753 m)   Wt 59 kg   SpO2 100%   BMI 19.20 kg/m  Physical Exam General: Awake, alert Cardiac: Normal rate Lungs: Normal effort Psych: Appropriate mood  ED Course / MDM  EKG:EKG Interpretation Date/Time:  Sunday November 23 2023 15:26:02 EDT Ventricular Rate:  88 PR Interval:  154 QRS Duration:  100 QT Interval:  392 QTC Calculation: 474 R Axis:   -70  Text Interpretation: Normal sinus rhythm with sinus arrhythmia Incomplete right bundle branch block Left anterior fascicular block Moderate voltage criteria for LVH, may be normal variant ( R in aVL , Cornell product ) ST & T wave abnormality, consider lateral ischemia Prolonged QT Abnormal ECG When compared with ECG of 23-Nov-2023 15:16, PREVIOUS ECG IS PRESENT Confirmed by Mannie Pac [phone]) on 11/25/2023 8:41:59 AM  I have reviewed the labs performed to date as well as medications administered while in observation.  Recent changes in the last 24 hours include medical workup which was unremarkable, patient has been accepted for placement in short-term rehab.  Overall looks well today.  Plan  Current plan is for placement.SABRA Mannie Pac T, DO 11/25/23 1357

## 2023-11-25 NOTE — Progress Notes (Addendum)
 1:50pm: Patient can go to [facility], room 126-A via PTAR - RN to call when ready. The number to call for report is [phone].  CSW informed RN and MD of information.  12:15pm Patient's insurance authorization has been approved, #[id number], next review date is 11/27/23. CSW made Kia aware.  12:10pm: Per patient's daughter, she wants to accept offer from [facility].  CSW initiated insurance authorization.  CSW informed Kia at [facility] of information.  10am: CSW spoke with [place] to present her with bed offers. Frankey states she will review choices and notify CSW with decision shortly.  7:55am: CSW received consult for possible SNF placement.  CSW spoke with patient's daughter Frankey to discuss PT recommendations as patient is not fully oriented a this time. Frankey states patient lives alone at [facility]. Frankey states patient has never been to SNF before but is agreeable for CSW to initiate the workup. Frankey states no preference on facility.  CSW will complete FL2 and fax patient's clinicals out for review to obtain bed offers.  Niels Portugal, MSW, LCSW Transitions of Care  Clinical Social Worker II [phone]

## 2023-11-25 NOTE — [place] FL2 (Signed)
 [place]  MEDICAID FL2 LEVEL OF CARE FORM     IDENTIFICATION  Patient Name: Matthew [name] Birthdate: 1941/04/27 Sex: male Admission Date (Current Location): 11/23/2023  [place] and [place] Number:  Producer, television/film/video and Address:  [facility] [place], [address], [place], [place] [address]      Provider Number: [id number]  Attending Physician Name and Address:  System, Provider Not In  Relative Name and Phone Number:       Current Level of Care: Hospital Recommended Level of Care: Skilled Nursing Facility Prior Approval Number:    Date Approved/Denied:   PASRR Number: [id number]  Discharge Plan: SNF    Current Diagnoses: Patient Active Problem List   Diagnosis Date Noted   Tremor 11/13/2021   History of prostate cancer 04/13/2018   Dental cavity 04/13/2018   PAD (peripheral artery disease) (HCC) 01/21/2018   Decreased hearing 01/15/2013   Unstable angina (HCC) 01/15/2013   ALLERGIC RHINITIS, SEASONAL 10/16/2009   Hyperlipidemia 08/29/2009   ESOPHAGEAL ULCER, WITH BLEEDING 10/25/2008   TOBACCO ABUSE 08/10/2007   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   Essential hypertension 07/24/2006   CHRONIC KIDNEY DISEASE STAGE 3 07/24/2006    Orientation RESPIRATION BLADDER Height & Weight     Self, Time, Place  Normal Continent Weight: 130 lb (59 kg) Height:  5' 9 (175.3 cm)  BEHAVIORAL SYMPTOMS/MOOD NEUROLOGICAL BOWEL NUTRITION STATUS      Continent Diet (Regular diet)  AMBULATORY STATUS COMMUNICATION OF NEEDS Skin   Extensive Assist Verbally Normal                       Personal Care Assistance Level of Assistance  Feeding, Bathing, Dressing Bathing Assistance: Limited assistance Feeding assistance: Limited assistance Dressing Assistance: Limited assistance     Functional Limitations Info  Sight, Hearing, Speech Sight Info: Impaired (Glasses) Hearing Info: Adequate Speech Info: Adequate    SPECIAL CARE FACTORS  FREQUENCY  PT (By licensed PT), OT (By licensed OT)     PT Frequency: 5x weekly OT Frequency: 5x weekly            Contractures Contractures Info: Not present    Additional Factors Info  Code Status, Allergies Code Status Info: Full Code Allergies Info: No known allergies           Current Medications (11/25/2023):  This is the current hospital active medication list Current Facility-Administered Medications  Medication Dose Route Frequency Provider Last Rate Last Admin   amLODipine  (NORVASC ) tablet 10 mg  10 mg Oral Daily Garrick Charleston, MD   10 mg at 11/24/23 1008   aspirin EC tablet 81 mg  81 mg Oral Daily Garrick Charleston, MD   81 mg at 11/24/23 1008   atorvastatin  (LIPITOR) tablet 40 mg  40 mg Oral Daily Garrick Charleston, MD   40 mg at 11/24/23 1008   latanoprost (XALATAN) 0.005 % ophthalmic solution 1 drop  1 drop Both Eyes QHS Garrick Charleston, MD   1 drop at 11/24/23 2252   metoprolol  succinate (TOPROL -XL) 24 hr tablet 25 mg  25 mg Oral Daily Garrick Charleston, MD   25 mg at 11/24/23 1008   sodium chloride 0.9 % bolus 1,000 mL  1,000 mL Intravenous Once Garrick Charleston, MD       Current Outpatient Medications  Medication Sig Dispense Refill   amLODipine  (NORVASC ) 10 MG tablet TAKE 1 TABLET BY MOUTH DAILY 90 tablet 3   aspirin 81 MG tablet Take  81 mg by mouth daily.     atorvastatin  (LIPITOR) 40 MG tablet TAKE 1 TABLET BY MOUTH DAILY 100 tablet 2   calcitRIOL (ROCALTROL) 0.25 MCG capsule Take by mouth. Takes Monday, Wednesday, and Friday     latanoprost (XALATAN) 0.005 % ophthalmic solution Place 1 drop into both eyes at bedtime.     metoprolol  succinate (TOPROL -XL) 25 MG 24 hr tablet TAKE 1 TABLET BY MOUTH ONCE  DAILY 100 tablet 2     Discharge Medications: Please see discharge summary for a list of discharge medications.  Relevant Imaging Results:  Relevant Lab Results:   Additional Information SSN: [id number]  Niels LITTIE Portugal, LCSW

## 2023-11-26 NOTE — Telephone Encounter (Signed)
 Spoke with patients daughter. About reaching out to her when the August Schedule comes open. [name], CMA

## 2023-12-31 NOTE — Progress Notes (Signed)
 Cardiology Office Note:  .   Date:  01/01/2024  ID:  Matthew [name], DOB Jun 30, 1940, MRN [id number] PCP: Alba Sharper, MD  [facility] Providers Cardiologist:  Shelda Bruckner, MD {  History of Present Illness: Matthew   LEBARON [name] is a 83 y.o. male with PMH chronic kidney disease, hypertension, remote chest pain who is seen for follow up.   CV history: He has previously been seen by advanced heart failure, Dr. McLean/Dr. Cherrie, last seen by Caffie Shed in 2017. He also follows with Dr. Sheree for his PAD, last seen 01/28/2020. He was recommended to continue aspirin , statin, cilostazol  at that time.  Despite being previously seen by heart failure, he does not have a history of severely reduced EF that I can see. Echo 2011 with EF 60%, nuclear stress 2011 EF 57% with decreased uptake in inferior wall. Nuclear stress 2014 EF 50% with fixed inferior defect without ischemia.  Today: Here with family member. Currently in SNF. No chest pain, shortness of breath, or claudication. Reviewed results of his echo showing EF 40-45%. Discussed prior stress test was normal, can't exclude ischemia but low suspicion based on global hypokinesis. Discussed guideline directed medical therapy, see below.  ROS: Denies chest pain, shortness of breath at rest or with normal exertion. No PND, orthopnea, LE edema or unexpected weight gain. No syncope or palpitations. ROS otherwise negative except as noted.   Studies Reviewed: Matthew    EKG:       Physical Exam:   VS:  BP 122/60 (BP Location: Right Arm, Patient Position: Sitting, Cuff Size: Normal)   Pulse 81   Ht 5' 9 (1.753 m)   Wt 141 lb 6.4 oz (64.1 kg)   SpO2 98%   BMI 20.88 kg/m    Wt Readings from Last 3 Encounters:  01/01/24 141 lb 6.4 oz (64.1 kg)  11/23/23 130 lb (59 kg)  11/03/23 130 lb (59 kg)    GEN: Well nourished, well developed in no acute distress HEENT: Normal, moist mucous membranes NECK: No JVD CARDIAC: regular  rhythm, normal S1 and S2, no rubs or gallops. No murmur. VASCULAR: Radial pulses 2+ bilaterally. Palpable but faint bilateral DP and PT pulses RESPIRATORY:  Clear to auscultation without rales, wheezing or rhonchi  ABDOMEN: Soft, non-tender, non-distended MUSCULOSKELETAL:  Ambulates independently with walker SKIN: Warm and dry, no edema NEUROLOGIC:  Alert and oriented x 3. No focal neuro deficits noted. PSYCHIATRIC:  Normal affect    ASSESSMENT AND PLAN: .    Cardiomyopathy, unclear etiology -appears euvolemic. He does not have history of heart failure clinically; nuclear stress EF 50% in 2014 with borderline LV dysfunction, no ischemia -echo 07/2023 with EF 40-45% -on metoprolol  succinate -we discussed changing amlodipine  to ARB or entresto given reduced EF. Last Cr 1.22, K 3.9, GFR 53. Discussed with patient and family, they would not like to make changes at this time as he is doing well. Would be willing to consider if Dr. Alica thought it was a good idea. Would favor ARB or ARNI over spironolactone if we can only add one kidney affecting medication. Would also prioritize ARB or ARNI over SLGT2i, denies any history of UTI.  PAD: -reviewed prior ABI -no longer on cilostazol , would not restart given abnormal EF and risk of heart failure symptoms -no claudication, rest pain, or nonhealing wounds to suggest severe ischemia -he has palpable distal pulses -continue aspirin  81 mg daily, atorvastatin  40 mg daily  Hypertension CKD stage 3a -see  medication recommendations as above Follows with Dr. Alica at [facility] every 6 mos.  Dispo: 6 months or sooner as needed  Signed, Shelda Bruckner, MD   Shelda Bruckner, MD, PhD, [facility] [place]  [facility]  [place]  [facility] at [facility] at [facility] [address] [place], [place] [address] [phone]

## 2024-01-01 ENCOUNTER — Ambulatory Visit (HOSPITAL_BASED_OUTPATIENT_CLINIC_OR_DEPARTMENT_OTHER): Admitting: Cardiology

## 2024-01-01 ENCOUNTER — Encounter (HOSPITAL_BASED_OUTPATIENT_CLINIC_OR_DEPARTMENT_OTHER): Payer: Self-pay | Admitting: Cardiology

## 2024-01-01 VITALS — BP 122/60 | HR 81 | Ht 69.0 in | Wt 141.4 lb

## 2024-01-01 DIAGNOSIS — I10 Essential (primary) hypertension: Secondary | ICD-10-CM

## 2024-01-01 DIAGNOSIS — I739 Peripheral vascular disease, unspecified: Secondary | ICD-10-CM

## 2024-01-01 DIAGNOSIS — I519 Heart disease, unspecified: Secondary | ICD-10-CM

## 2024-01-01 DIAGNOSIS — N1831 Chronic kidney disease, stage 3a: Secondary | ICD-10-CM

## 2024-01-01 NOTE — Patient Instructions (Signed)
 Medication Instructions:  Your physician recommends that you continue on your current medications as directed. Please refer to the Current Medication list given to you today. *If you need a refill on your cardiac medications before your next appointment, please call your pharmacy*  Lab Work: None ordered If you have labs (blood work) drawn today and your tests are completely normal, you will receive your results only by: MyChart Message (if you have MyChart) OR A paper copy in the mail If you have any lab test that is abnormal or we need to change your treatment, we will call you to review the results.  Testing/Procedures: None ordered  Follow-Up: At [facility], you and your health needs are our priority.  As part of our continuing mission to provide you with exceptional heart care, our providers are all part of one team.  This team includes your primary Cardiologist (physician) and Advanced Practice Providers or APPs (Physician Assistants and Nurse Practitioners) who all work together to provide you with the care you need, when you need it.  Your next appointment:   6 month(s)  Provider:   Shelda Bruckner, MD    We recommend signing up for the patient portal called MyChart.  Sign up information is provided on this After Visit Summary.  MyChart is used to connect with patients for Virtual Visits (Telemedicine).  Patients are able to view lab/test results, encounter notes, upcoming appointments, etc.  Non-urgent messages can be sent to your provider as well.   To learn more about what you can do with MyChart, go to [email or web address].   Other Instructions

## 2024-01-05 ENCOUNTER — Telehealth: Payer: Self-pay

## 2024-01-05 NOTE — Telephone Encounter (Signed)
 Attempted to reach patients daughter Matthew [name] Ph# [phone]. To see if they were still planning on keeping the Aug. 14th [facility] appt at 2:30. LVM to gives us  a call if they are not going to be able to keep appt [name], CMA

## 2024-01-05 NOTE — Telephone Encounter (Signed)
 Spoke with patients daughter Frankey Lesches Ph# [phone]. To see if they were keeping there [facility] appt for [facility] Aug 14th at 2:30. She stated that they are and will also bring the package that was sent out. [name], CMA

## 2024-01-08 ENCOUNTER — Ambulatory Visit (INDEPENDENT_AMBULATORY_CARE_PROVIDER_SITE_OTHER): Admitting: Family Medicine

## 2024-01-08 VITALS — BP 105/70 | HR 79 | Ht 69.0 in | Wt 140.0 lb

## 2024-01-08 DIAGNOSIS — E538 Deficiency of other specified B group vitamins: Secondary | ICD-10-CM

## 2024-01-08 DIAGNOSIS — R32 Unspecified urinary incontinence: Secondary | ICD-10-CM

## 2024-01-08 DIAGNOSIS — I5022 Chronic systolic (congestive) heart failure: Secondary | ICD-10-CM | POA: Insufficient documentation

## 2024-01-08 DIAGNOSIS — F01518 Vascular dementia, unspecified severity, with other behavioral disturbance: Secondary | ICD-10-CM

## 2024-01-08 DIAGNOSIS — I10 Essential (primary) hypertension: Secondary | ICD-10-CM | POA: Diagnosis not present

## 2024-01-08 DIAGNOSIS — R4689 Other symptoms and signs involving appearance and behavior: Secondary | ICD-10-CM

## 2024-01-08 DIAGNOSIS — E7849 Other hyperlipidemia: Secondary | ICD-10-CM | POA: Diagnosis not present

## 2024-01-08 DIAGNOSIS — N2581 Secondary hyperparathyroidism of renal origin: Secondary | ICD-10-CM

## 2024-01-08 DIAGNOSIS — Z604 Social exclusion and rejection: Secondary | ICD-10-CM

## 2024-01-08 DIAGNOSIS — D7589 Other specified diseases of blood and blood-forming organs: Secondary | ICD-10-CM

## 2024-01-08 DIAGNOSIS — R413 Other amnesia: Secondary | ICD-10-CM

## 2024-01-08 NOTE — Patient Instructions (Addendum)
 We will ask our social worker to contact your daughter, Ms Arloa.

## 2024-01-09 ENCOUNTER — Encounter: Payer: Self-pay | Admitting: Family Medicine

## 2024-01-09 DIAGNOSIS — Z604 Social exclusion and rejection: Secondary | ICD-10-CM | POA: Insufficient documentation

## 2024-01-09 DIAGNOSIS — R32 Unspecified urinary incontinence: Secondary | ICD-10-CM | POA: Insufficient documentation

## 2024-01-09 DIAGNOSIS — R4689 Other symptoms and signs involving appearance and behavior: Secondary | ICD-10-CM | POA: Insufficient documentation

## 2024-01-09 DIAGNOSIS — F01518 Vascular dementia, unspecified severity, with other behavioral disturbance: Secondary | ICD-10-CM | POA: Insufficient documentation

## 2024-01-09 DIAGNOSIS — D7589 Other specified diseases of blood and blood-forming organs: Secondary | ICD-10-CM | POA: Insufficient documentation

## 2024-01-09 NOTE — Assessment & Plan Note (Signed)
 Established problem worsened.  Today's Vitals   01/08/24 1430 01/08/24 1431  BP: 100/68 105/70  Pulse: 79   SpO2: 100%   Weight: 140 lb (63.5 kg)   Height: 5' 9 (1.753 m)   PainSc: 0-No pain    Change: Decrease amlodipine  from 10 mg daily to 5 mg daily.  Continue: Metoprolol  XL 25 mg daily

## 2024-01-09 NOTE — Assessment & Plan Note (Addendum)
 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 Dependence in iADLs.   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   Discussed role of diet, exercise and social activity in slowing progression of dementia, along with the role of reversible cardiovascular risk factor reduction.    Matthew [name] is planning to enroll her father into [facility], though there is concern that his LTC Medicaid will not cover [facility].  She would like to consult with the outpatient [facility] social work to discuss disposition options should Mr Basnett not be able to afford P[facility].   Given Mr Freiman's resistance to care, he may not assent to any disposition other than his [facility] apartment upon leaving SNF.  He is at risk of relapsing into self-neglect. Matthew [name] may need advisement how to file for guardianship of her father.

## 2024-01-09 NOTE — Progress Notes (Signed)
 Provider:  Krystal Bale, MD Location:   [facility]   Place of Service:     PCP: Alba Sharper, MD Patient Care Team: Alba Sharper, MD as PCP - General (Family Medicine) Lonni Slain, MD as PCP - Cardiology (Cardiology) Rayburn Pac, MD as Consulting Physician (Nephrology) [facility] Rolan Ezra RAMAN, MD as Consulting Physician (Cardiology)  Extended Emergency Contact Information Primary Emergency Contact: [facility] Address: [address]          [place], [place] [address] [place] Home Phone: [phone] Work Phone: [phone] Relation: Daughter  Code Status: DNR Goals of Care: Advanced Directive information    01/08/2024    2:19 PM  Advanced Directives  Does Patient Have a Medical Advance Directive? Yes  Type of Advance Directive Healthcare Power of Attorney  Does patient want to make changes to medical advance directive? No - Patient declined  Copy of Healthcare Power of Attorney in Chart? Yes - validated most recent copy scanned in chart (See row information)     [facility]:   Patient is accompanied by daughter, Frankey Lesches  Primary caregiver:  Frankey Lesches Patient's Currently living arrangement:  Currently at [facility] for SNF, residing alone in apartment at [facility] Patient information was obtained from ER records, lab reports, office notes, and Brain MRI and Head CT reports and Ms Lesches. History/Exam limitations:  dementia Primary Care Provider:   EMERSON Alba, MD Referring provider:  S. Lafe, DO Reason for referral:  memory problems  Healthcare Insurances: [employer] ----------------------------------------------------------------------------------------------------------------------------------------------------------------------------------------------------------------------------------------------------------------   HPI by problems:  Chief Complaint  Patient presents with   Memory Loss    The patient denies any difficulties with his memory or thinking.   Ms Harris's report of memory/thinking concerns Are there problems with thinking?  Cognition domains: Memory difficulties, Social interactions difficulties, Task performance difficulties, and Self-correction difficulties  When were the changes first noticed?  months to year  Did this change occur abruptly or gradually?  Gradual decline over last several years with increased social withdrawal, with a more rapid decline three to four months ago  How have the changes progressed since then?  gradually worsening  Has there been any tremors or abnormal movements?  no  Have they had in hallucinations or delusions:  Suspiciousness  Have they appeared more anxious or sad lately?  yes, more withdrawn, more irritable  Do they still have interests or activities they enjor doing?  no  How has their appetite been lately?  Had lost weight living alone, but has gained back weight since admission to [facility] after admission to ED in last june  Compared to 5 to 10 years ago, how is the patient at:  Problems with Judgment, e.g., problem making decisions, bad financial decisions, problems with thinking?  yes  Less interested in hobbies or previously enjoyed activities?  yes  Problem remembering things about family and friends e.g. names,  occupations, birthdays, addresses?  no  Problem remembering conversations or news events a few days later?  yes  Problem remembering what day and month it is? no  Problem with losing things?  no  Problem learning to use a  new gadget or machine around the house, e.g., cell phones, computer, microwave, remote control?  no  Problem handling financial matters, e.g. their pension, checking, credit cards, dealing with the bank?  yes  Problem with getting lost in familiar places?  no  Behavioral Issues (+) suspiciousness  Hallucinations No  Activity Disturbances (+) Not changing clothes and disagreeing with family members telling him he has not changed soiled clothes.  Disagreeing that clothes are soiled (urine)  Aggressiveness (+) Agitation, e.g., expression of anger when family notes impaired self-care, negativity including refusal to bath, dress, attending social events. Not getting up to let family or neighbors to come into his apartment.  Diurinal Rhythm Disturbances Staying in bed during day  Self-neglect Ms Arloa reports the last few months before her father's admission to SNF he was living in an unkept apartment with urine and feces apparent, along with not eating though he was provisioned by his family with microwavable meals he preferred.   Global Rating - Symptoms have been very troubling to the caregivers and dangerous to the patient   Geriatric Depression Scale:  2 / 15  PHQ-9: Flowsheet Row Office Visit from 01/08/2024 in [facility] [place]  PHQ-9 Total Score 3     Outpatient Encounter Medications as of 01/08/2024  Medication Sig   amLODipine  (NORVASC ) 10 MG tablet TAKE 1 TABLET BY MOUTH DAILY   aspirin  81 MG tablet Take 81 mg by mouth daily.   atorvastatin  (LIPITOR) 40 MG tablet TAKE 1 TABLET BY MOUTH DAILY   latanoprost  (XALATAN ) 0.005 % ophthalmic solution Place 1 drop into both eyes at bedtime.   metoprolol  succinate (TOPROL -XL) 25 MG 24 hr tablet TAKE 1 TABLET BY MOUTH ONCE  DAILY   senna (SENOKOT) 8.6 MG tablet Take 1 tablet by mouth at bedtime.   No facility-administered encounter medications on file as of 01/08/2024.     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) 01/08/2024    Priority: High   PAD (peripheral artery disease) (HCC) 01/21/2018    Priority: High   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    Priority: High   CKD stage G3b/A1, GFR 30-44 and albumin creatinine ratio <30 mg/g (HCC) 07/24/2006    Priority: High   At high risk for self neglect 01/09/2024    Priority: Medium    Hyperlipidemia 10/25/2008    Priority: Medium    TOBACCO ABUSE 08/10/2007    Priority: Medium    Essential hypertension 07/24/2006    Priority: Medium    Urinary incontinence 01/09/2024    Priority: Low   Tremor 11/13/2021    Priority: Low   Decreased hearing 01/15/2013    Priority: Low   ALLERGIC RHINITIS, SEASONAL 10/16/2009    Priority: Low   Social isolation 01/09/2024   Macrocytosis 01/09/2024   Hyperbilirubinemia 01/09/2024   Past Medical History:  Diagnosis Date   ADENOCARCINOMA, PROSTATE 10/25/2008   Qualifier: Diagnosis of  By: Bari MD, Theodoro   S/p robotic prostatectomy in 2010    ALLERGIC RHINITIS, SEASONAL 10/16/2009   Qualifier: Diagnosis of  By: Bari MD, [facility]     CHRONIC KIDNEY DISEASE STAGE 3 07/24/2006   Qualifier: Diagnosis of  By: JANEY PARODY   Followed by Dr. 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   Qualifier: Diagnosis of  By: Edsel Mems     Decreased hearing 01/15/2013   Dental cavity 04/13/2018   ESOPHAGEAL ULCER, WITH BLEEDING 10/25/2008   Qualifier: Diagnosis of  ByBETHA Sax MD, [place]     History of prostate cancer 04/13/2018   HYPERLIPIDEMIA 08/29/2009   Qualifier: Diagnosis of  By: Gardenia Pao   Statin therapy    HYPERTENSION, BENIGN SYSTEMIC 07/24/2006   Qualifier: Diagnosis  of  By: JANEY PARODY     PAD (peripheral artery disease) (HCC)    SECONDARY HYPERPARATHYROIDISM 05/04/2007   Qualifier: Diagnosis of  By: [place], Bonnie     Social isolation 01/09/2024   TOBACCO ABUSE 08/10/2007   Qualifier: Diagnosis of  By: CLORIA CLEVELAND MD, MAKEECHA     Transfusion history 05/27/2008   Unstable angina (HCC) 01/15/2013   Past Surgical History:  Procedure Laterality Date   CORONARY ARTERY BYPASS GRAFT     INGUINAL HERNIA REPAIR  2009   PROSTATECTOMY  2010   robotic   Family History  Problem Relation Age of Onset   Kidney disease Mother    Hypertension Brother    Hypertension Other    Prostate cancer Other        grandfather   Social History   Socioeconomic History   Marital status: Widowed    Spouse name: Not on file   Number of children: 4   Years of education: 86   Highest education level: 12th grade  Occupational History    Comment: truck driver - retired   Occupation: Diplomatic Services operational officer    Comment: retired  Tobacco Use   Smoking status: Every Day    Current packs/day: 0.30    Average packs/day: 0.3 packs/day for 57.6 years (17.3 ttl pk-yrs)    Types: Pipe, Cigarettes    Start date: 05/1966    Passive exposure: Current   Smokeless tobacco: Never   Tobacco comments:    Pipe smoker  Vaping Use   Vaping status: Never Used  Substance and Sexual Activity   Alcohol use: Not Currently   Drug use: No   Sexual activity: Not Currently  Other Topics Concern   Not on file  Social History Narrative   Patient lives alone in [place].    Patients daughter Frankey lives in the area.    Patient does not drive and she is his form of transportation.    Frankey helps organize his medications and helps with grocery/pharmacy shopping needs.    Patient walks ~ 10 minutes each day.   Social Drivers of Corporate investment banker Strain: Low Risk  (01/04/2024)   Overall Financial Resource Strain (CARDIA)    Difficulty of Paying Living Expenses: Not  hard at all  Food Insecurity: Food Insecurity Present (01/04/2024)   Hunger Vital Sign    Worried About Running Out of Food in the Last Year: Never true    Ran Out of Food in the Last Year: Sometimes true  Transportation Needs: No Transportation Needs (01/04/2024)   PRAPARE - Administrator, Civil Service (Medical): No    Lack of Transportation (Non-Medical): No  Physical Activity: Inactive (01/04/2024)   Exercise Vital Sign    Days of Exercise per Week: 0 days    Minutes of Exercise per Session: Not on file  Stress: No Stress Concern Present (01/04/2024)   [employer] of Occupational Health - Occupational Stress Questionnaire    Feeling of Stress: Not at all  Social Connections: Socially Isolated (01/04/2024)   Social Connection and Isolation Panel    Frequency of Communication with Friends and Family: More than three times a week    Frequency of Social Gatherings with Friends and Family: Three  times a week    Attends Religious Services: Patient declined    Active Member of Clubs or Organizations: No    Attends Banker Meetings: Not on file    Marital Status: Widowed      Cardiovascular Risk Factors: CVA, HFmEF Educational History: 12 years formal education Personal History of Seizures: No -  Personal History of Stroke: Yes - No history of stroke.  Incidental finding on brain MRI Personal History of Psychiatric Disorders: No -   Basic Activities of Daily Living  Dressing: Partial assistance Eating: Self-care Ambulation: Partial assistance Toileting: Partial assistance Bathing: Total assistance  Instrumental Activities of Daily Living Shopping: Total assistance House/Yard Work: Total assistance Administration of medications: Total assistance Finances: Total assistance Telephone: Self-care Transportation: Total assistance  Mobility Assist Devices: Rollator  Caregivers in home: Lives alone in [facility]  Caregiver Stress Self-Assessment  (Zarit Score):  40 out of 80 Scoring: 0-20 = Little/No stress       21-40 = Mild/Moderate stress       41-60 = Moderate/Severe stress      61-80 = Severe stress  FALLS in last five office visits:     01/08/2024    2:28 PM 11/03/2023    4:14 PM 03/10/2023    3:50 PM 01/20/2023    9:09 AM 05/03/2022    9:39 AM  Fall Risk   Falls in the past year? 1 1 0 0 0  Number falls in past yr: 0 1 0 0 0  Injury with Fall? 0 0 0 0 0  Risk for fall due to :  History of fall(s);Impaired mobility  Impaired mobility   Follow up  Falls prevention discussed  Falls prevention discussed;Education provided;Falls evaluation completed     Health Maintenance reviewed: Immunization History  Administered Date(s) Administered   Fluad Quad(high Dose 65+) 06/08/2020, 05/03/2022   Fluad Trivalent(High Dose 65+) 03/10/2023   Influenza Split 03/18/2011   Influenza Whole 03/03/2008   Influenza,inj,Quad PF,6+ Mos 04/26/2013, 04/03/2015, 03/04/2017, 01/20/2018, 02/23/2019   Influenza-Unspecified 03/18/2016   PFIZER(Purple Top)SARS-COV-2 Vaccination 07/18/2019, 08/11/2019, 03/10/2020, 09/05/2020   Pfizer(Comirnaty)Fall Seasonal Vaccine 12 years and older 05/03/2022, 03/10/2023   Pneumococcal Conjugate-13 04/03/2015   Pneumococcal Polysaccharide-23 03/03/2008   Td 07/02/2004   Health Maintenance Topics with due status: Overdue     Topic Date Due   Zoster Vaccines- Shingrix Never done   DTaP/Tdap/Td 07/02/2014   COVID-19 Vaccine 09/08/2023   Health Maintenance Topics with due status: Due On     Topic Date Due   INFLUENZA VACCINE 12/26/2023   Health Maintenance Topics with due status: Due Soon     Topic Date Due   Medicare Annual Wellness (AWV) 01/20/2024    Diet: Regular   Geriatric Syndromes: Incontinence yes, wearing adult undergarments  Visual Impairment yes   Hearing impairment yes Impaired Memory or Cognition yes   Sleep problems yes   Weight loss thru self-neglect that has resolved with  admission to SNF   Vital Signs Weight: 140 lb (63.5 kg) Body mass index is 20.67 kg/m. Estimated Creatinine Clearance: 36.3 mL/min (A) (by C-G formula based on SCr of 1.41 mg/dL (H)). Body surface area is 1.76 meters squared. Vitals:   01/08/24 1430 01/08/24 1431  BP: 100/68 105/70  Pulse: 79   SpO2: 100%   Weight: 140 lb (63.5 kg)   Height: 5' 9 (1.753 m)    Wt Readings from Last 3 Encounters:  01/08/24 140 lb (63.5 kg)  01/01/24 141 lb 6.4 oz (  64.1 kg)  11/23/23 130 lb (59 kg)   Hearing Screening   250Hz  500Hz  1000Hz  2000Hz  3000Hz  4000Hz   Right ear 40 40 40 40 40 40  Left ear 40 40 40 40 40 40   Vision Screening   Right eye Left eye Both eyes  Without correction     With correction 20/200 20/200 20/200  Comments: LAST EYE EXAM WAS September 15, 2023 [facility]   Physical Examination:  VS reviewed Physical Exam  Vitals:   01/08/24 1430 01/08/24 1431  BP: 100/68 105/70  Pulse: 79   SpO2: 100%      Mini-Mental State Examination or Montreal Cognitive Assessment:  Patient did  require additional cues or prompts to complete tasks. Patient was cooperative and attentive to testing tasks Patient did  appear motivated to perform well       01/20/2023    9:10 AM 01/01/2022    4:26 PM 09/05/2020    2:58 PM 07/20/2018    3:48 PM  6CIT Screen  What Year? 0 points 0 points 0 points 0 points  What month? 0 points 0 points 0 points 0 points  What time? 0 points 0 points 0 points 0 points  Count back from 20 2 points 2 points 0 points 0 points  Months in reverse 2 points 2 points 0 points 0 points  Repeat phrase 4 points 0 points 0 points 0 points  Total Score 8 points 4 points 0 points 0 points       07/20/2018    3:47 PM  MMSE - Mini Mental State Exam  Orientation to time 5  Orientation to Place 5  Registration 3  Attention/ Calculation 5  Recall 3  Language- name 2 objects 2  Language- repeat 1  Language- follow 3 step command 3  Language- read & follow  direction 1  Write a sentence 1  Copy design 1  Total score 30           01/09/2024    8:12 AM  Montreal Cognitive Assessment   Visuospatial/ Executive (0/5) 2  Naming (0/3) 3  Attention: Read list of digits (0/2) 2  Attention: Read list of letters (0/1) 0  Attention: Serial 7 subtraction starting at 100 (0/3) 1  Language: Repeat phrase (0/2) 1  Language : Fluency (0/1) 0  Abstraction (0/2) 0  Delayed Recall (0/5) 1  Orientation (0/6) 5  Total 15  Adjusted Score (based on education) 16     Labs No components found for: [facility]  Lab Results  Component Value Date   VITAMINB12 240 01/08/2024    Lab Results  Component Value Date   FOLATE 11.9 01/08/2024    Lab Results  Component Value Date   TSH 2.340 11/11/2023    No results found for: RPR  No results found for: HIV    Chemistry      Component Value Date/Time   NA 131 (L) 11/23/2023 1515   NA 135 11/11/2023 1722   K 3.9 11/23/2023 1515   CL 99 11/23/2023 1515   CO2 19 (L) 11/23/2023 1515   BUN 16 11/23/2023 1515   BUN 14 11/11/2023 1722   CREATININE 1.41 (H) 01/08/2024 1550   CREATININE 1.51 (H) 05/02/2016 1001      Component Value Date/Time   CALCIUM  9.4 01/08/2024 1550   CALCIUM  9.5 03/03/2008 2243   ALKPHOS 115 11/23/2023 1515   AST 20 11/23/2023 1515   ALT 12 11/23/2023 1515   BILITOT 3.3 (H)  11/23/2023 1515   BILITOT 1.4 (H) 11/11/2023 1722        Latest Ref Rng & Units 11/23/2023    3:15 PM 11/11/2023    5:22 PM 11/13/2021    4:38 PM  Hepatic Function  Total Protein 6.5 - 8.1 g/dL 6.9  6.3  7.0   Albumin 3.5 - 5.0 g/dL 3.8  4.1  3.9   AST 15 - 41 U/L 20  19  17    ALT 0 - 44 U/L 12  14  10    Alk Phosphatase 38 - 126 U/L 115  143  153   Total Bilirubin 0.0 - 1.2 mg/dL 3.3  1.4  1.1       Estimated Creatinine Clearance: 36.3 mL/min (A) (by C-G formula based on SCr of 1.41 mg/dL (H)).   Lab Results  Component Value Date   HGBA1C 4.9 11/07/2023     @10RELATIVEDAYS @ Hearing  Screening   250Hz  500Hz  1000Hz  2000Hz  3000Hz  4000Hz   Right ear 40 40 40 40 40 40  Left ear 40 40 40 40 40 40   Vision Screening   Right eye Left eye Both eyes  Without correction     With correction 20/200 20/200 20/200  Comments: LAST EYE EXAM WAS September 15, 2023 [facility]  Lab Results  Component Value Date   WBC 7.5 01/08/2024   HGB 14.3 01/08/2024   HCT 42.9 01/08/2024   MCV 105 (H) 01/08/2024   PLT 320 01/08/2024    No results found for this or any previous visit (from the past 24 hours).   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   Advanced Directives Code Status: Full Advance Directives: HCPOA agent: daughter, Frankey Lesches Full code   ------------------------------------------------------------------------------------------------------------------------------------------------------------------------------------------------------------------------------------------------------------------------------------------------------------------------------------------------------------------------------------------------------------------------------------------------------------------------------------------------------------  Assessment and Plan: Please see individual consultation notes from physical therapy, pharmacy and social work for today.    Problem List Items Addressed This Visit       High   Secondary renal hyperparathyroidism (HCC)   PTH    Component Value Date/Time   PTH 88 (H) 01/08/2024 1550   PTH Comment 01/08/2024 1550  PTH 171 (H) 2009, PTH 94 (H) 2008  Phosphorus 3.6 (within normal limits) Calcium  9.6  Findings remain consistent with secondary hyperparathyroidism.  Recommend Vitamin D supplement (Cholcalciferol) 1000 units daily.         Relevant Orders   Ca+Creat+P+PTH Intact (Completed)   Vascular dementia of acute onset with  behavioral disturbance (HCC) - Primary   New diagnosis MoCA 16/30 with evidence of progressive cognitive decline given Normal MMSE score 2020, then 6-CIT 4 points 2023 then 8 points 12/2022. Dependence in iADLs.   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   Discussed role of diet, exercise and social activity in slowing progression of dementia, along with the role of reversible cardiovascular risk factor reduction.    Ms Lesches is planning to [facility] her father [facility], though there is concern that his LTC Medicaid will not cover [facility].  She would like to consult with the outpatient [facility] social work to discuss disposition options should Mr Bolger not be able to afford PACE.   Given Mr Auble's resistance to care, he may not assent to any disposition other than his [facility] apartment upon leaving SNF.  He is at risk of  relapsing into self-neglect. Ms Arloa may need advisement how to file for guardianship of her father.       Relevant Orders   AMB Referral VBCI Care Management     Medium    Essential hypertension   Established problem worsened.  Today's Vitals   01/08/24 1430 01/08/24 1431  BP: 100/68 105/70  Pulse: 79   SpO2: 100%   Weight: 140 lb (63.5 kg)   Height: 5' 9 (1.753 m)   PainSc: 0-No pain    Change: Decrease amlodipine  from 10 mg daily to 5 mg daily.  Continue: Metoprolol  XL 25 mg daily       At high risk for self neglect   Relevant Orders   AMB Referral VBCI Care Management   Hyperlipidemia     Low   Urinary incontinence     Unprioritized   Social isolation   Relevant Orders   AMB Referral VBCI Care Management   Macrocytosis   New onset macrocytosis without anemia History of low vitamin b12 and folate in 2016.  Denies alcohol use.  Normal TSH Elevated TBili could be  consistent with liver disease, though normal LFTs and alk phos.   Follow up hepatic panel, b12 and folate levels.         Relevant Orders   Reticulocytes (Completed)   CBC with Differential (Completed)   Hyperbilirubinemia   Relevant Orders   Hepatic function panel   Other Visit Diagnoses       Low folate       Relevant Orders   Folate, RBC and Serum (Completed)     Low serum vitamin B12       Relevant Orders   Vitamin B12 (Completed)      Essential hypertension Established problem worsened.  Today's Vitals   01/08/24 1430 01/08/24 1431  BP: 100/68 105/70  Pulse: 79   SpO2: 100%   Weight: 140 lb (63.5 kg)   Height: 5' 9 (1.753 m)   PainSc: 0-No pain    Change: Decrease amlodipine  from 10 mg daily to 5 mg daily.  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 Dependence in iADLs.   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   Discussed role of diet, exercise and social activity in slowing progression of dementia, along with the role of reversible cardiovascular risk factor reduction.    Ms Arloa is planning to enroll her father into [facility], though there is concern that his LTC Medicaid will not cover [facility].  She would like to consult with the outpatient [facility] social work to discuss disposition options should Mr Wegener not be able to afford PACE.   Given Mr Medero's resistance to care, he may not assent to any disposition other than his [facility] apartment upon leaving SNF.  He is at risk of relapsing into self-neglect. Ms Arloa may need advisement how to file for guardianship of her  father.   Macrocytosis New onset macrocytosis without anemia History of low vitamin b12 and folate in 2016.  Denies alcohol use.  Normal TSH Elevated TBili could be consistent with liver disease, though normal LFTs and alk phos.   Follow up hepatic panel, b12 and  folate levels.     Secondary renal hyperparathyroidism (HCC) PTH    Component Value Date/Time   PTH 88 (H) 01/08/2024 1550   PTH Comment 01/08/2024 1550  PTH 171 (H) 2009, PTH 94 (H) 2008  Phosphorus 3.6 (within normal limits) Calcium  9.6  Findings remain consistent with secondary hyperparathyroidism.  Recommend Vitamin D supplement (Cholcalciferol) 1000 units daily.        Primary Contact: Extended Emergency Contact Information Primary Emergency Contact: Harris,[place] Address: [address]          [place], [place] [address] [place] Home Phone: [phone] Work Phone: [phone] Relation: Daughter

## 2024-01-09 NOTE — Assessment & Plan Note (Signed)
 New onset macrocytosis without anemia History of low vitamin b12 and folate in 2016.  Denies alcohol use.  Normal TSH Elevated TBili could be consistent with liver disease, though normal LFTs and alk phos.   Follow up hepatic panel, b12 and folate levels.   Addendum 01/12/24 Vit B12 240 (lower end of normal0 Normal Folate  Would consider obtaining patient methylmalonic acid serum level to see if he has functional Vit B12 deficiency

## 2024-01-10 LAB — CA+CREAT+P+PTH INTACT
Calcium: 9.4 mg/dL (ref 8.6–10.2)
Creatinine, Ser: 1.41 mg/dL — ABNORMAL HIGH (ref 0.76–1.27)
PTH: 88 pg/mL — ABNORMAL HIGH (ref 15–65)
Phosphorus: 3.6 mg/dL (ref 2.8–4.1)
eGFR: 50 mL/min/1.73 — ABNORMAL LOW (ref >59)

## 2024-01-10 LAB — CBC WITH DIFFERENTIAL/PLATELET
Basophils Absolute: 0.1 x10E3/uL (ref 0.0–0.2)
Basos: 1 %
EOS (ABSOLUTE): 0.3 x10E3/uL (ref 0.0–0.4)
Eos: 4 %
Hematocrit: 42.9 % (ref 37.5–51.0)
Hemoglobin: 14.3 g/dL (ref 13.0–17.7)
Immature Grans (Abs): 0 x10E3/uL (ref 0.0–0.1)
Immature Granulocytes: 0 %
Lymphocytes Absolute: 2 x10E3/uL (ref 0.7–3.1)
Lymphs: 27 %
MCH: 35.1 pg — ABNORMAL HIGH (ref 26.6–33.0)
MCHC: 33.3 g/dL (ref 31.5–35.7)
MCV: 105 fL — ABNORMAL HIGH (ref 79–97)
Monocytes Absolute: 1.1 x10E3/uL — ABNORMAL HIGH (ref 0.1–0.9)
Monocytes: 15 %
Neutrophils Absolute: 3.9 x10E3/uL (ref 1.4–7.0)
Neutrophils: 53 %
Platelets: 320 x10E3/uL (ref 150–450)
RBC: 4.07 x10E6/uL — ABNORMAL LOW (ref 4.14–5.80)
RDW: 12.6 % (ref 11.6–15.4)
WBC: 7.5 x10E3/uL (ref 3.4–10.8)

## 2024-01-10 LAB — FOLATE, RBC AND SERUM
Folate, Hemolysate: 254 ng/mL
Folate, RBC: 592 ng/mL (ref >498)
Folate: 11.9 ng/mL (ref >3.0)

## 2024-01-10 LAB — RETICULOCYTES: Retic Ct Pct: 1.2 % (ref 0.6–2.6)

## 2024-01-10 LAB — VITAMIN B12: Vitamin B-12: 240 pg/mL (ref 232–1245)

## 2024-01-12 ENCOUNTER — Telehealth: Payer: Self-pay | Admitting: *Deleted

## 2024-01-12 ENCOUNTER — Ambulatory Visit: Payer: Self-pay | Admitting: Family Medicine

## 2024-01-12 NOTE — Assessment & Plan Note (Signed)
 PTH    Component Value Date/Time   PTH 88 (H) 01/08/2024 1550   PTH Comment 01/08/2024 1550  PTH 171 (H) 2009, PTH 94 (H) 2008  Phosphorus 3.6 (within normal limits) Calcium  9.6  Findings remain consistent with secondary hyperparathyroidism.  Recommend Vitamin D supplement (Cholcalciferol) 1000 units daily.

## 2024-01-12 NOTE — Progress Notes (Signed)
 Complex Care Management Note  Care Guide Note 01/12/2024 Name: Matthew [name] MRN: [id number] DOB: 1940-08-03  Matthew [name] is a 83 y.o. year old male who sees Alba Sharper, MD for primary care. I reached out to [name] by phone today to offer complex care management services.  [name] daughter [name] was given information about Complex Care Management services today including:   The Complex Care Management services include support from the care team which includes your Nurse Care Manager, Clinical Social Worker, or Pharmacist.  The Complex Care Management team is here to help remove barriers to the health concerns and goals most important to you. Complex Care Management services are voluntary, and the patient may decline or stop services at any time by request to their care team member.   Complex Care Management Consent Status: Patient agreed to services and verbal consent obtained.   Follow up plan:  Telephone appointment with complex care management team member scheduled for:  02/03/24  Encounter Outcome:  Patient Scheduled  Harlene Satterfield  [facility]  [facility], [facility] Guide  Direct Dial: [phone]  Fax [phone]

## 2024-01-12 NOTE — Progress Notes (Signed)
 Complex Care Management Note Care Guide Note  01/12/2024 Name: Matthew [name] MRN: [id number] DOB: 30-Jan-1941   Complex Care Management Outreach Attempts: An unsuccessful telephone outreach was attempted today to offer the patient information about available complex care management services.  Follow Up Plan:  Additional outreach attempts will be made to offer the patient complex care management information and services.   Encounter Outcome:  No Answer  Harlene Satterfield  [facility]  [facility], [facility] Guide  Direct Dial: [phone]  Fax [phone]

## 2024-01-27 ENCOUNTER — Other Ambulatory Visit: Payer: Self-pay

## 2024-01-27 DIAGNOSIS — I10 Essential (primary) hypertension: Secondary | ICD-10-CM

## 2024-01-27 MED ORDER — METOPROLOL SUCCINATE ER 25 MG PO TB24: 25.0000 mg | ORAL_TABLET | Freq: Every day | ORAL | 2 refills | Status: AC

## 2024-01-28 ENCOUNTER — Telehealth: Admitting: Licensed Clinical Social Worker

## 2024-01-30 ENCOUNTER — Ambulatory Visit: Admitting: Student

## 2024-01-30 ENCOUNTER — Encounter: Payer: Self-pay | Admitting: Student

## 2024-01-30 VITALS — BP 128/85 | HR 107 | Ht 69.0 in | Wt 131.2 lb

## 2024-01-30 DIAGNOSIS — Z23 Encounter for immunization: Secondary | ICD-10-CM | POA: Diagnosis not present

## 2024-01-30 DIAGNOSIS — R251 Tremor, unspecified: Secondary | ICD-10-CM | POA: Diagnosis not present

## 2024-01-30 DIAGNOSIS — R000 Tachycardia, unspecified: Secondary | ICD-10-CM | POA: Diagnosis not present

## 2024-01-30 DIAGNOSIS — F01518 Vascular dementia, unspecified severity, with other behavioral disturbance: Secondary | ICD-10-CM

## 2024-01-30 DIAGNOSIS — R4689 Other symptoms and signs involving appearance and behavior: Secondary | ICD-10-CM

## 2024-01-30 NOTE — Patient Instructions (Addendum)
 It was great to see you! Thank you for allowing me to participate in your care!   I recommend that you always bring your medications to each appointment as this makes it easy to ensure we are on the correct medications and helps us  not miss when refills are needed.  Our plans for today:  - I have placed orders for DME shower chair/showerhead.  This should receive a call. - I recommend scheduling an annual appointment with your PCP  Take care and seek immediate care sooner if you develop any concerns. Please remember to show up 15 minutes before your scheduled appointment time!  Gladis Church, DO [facility]

## 2024-01-30 NOTE — Assessment & Plan Note (Signed)
 Reliant on caregivers for IADLs including: Paying bills, hygiene, making food.  His dementia/tremor make it difficult for him to stand in the shower and make difficult for caregivers to appropriately bathe him.  He has high risk for falling, and recently had a fall in June which led him to skilled nursing facility for rehab for 2 months.  I recommend shower chair/detachable showerhead to assist with his bathing. - DME order placed, routed to RN team - Flu shot today

## 2024-01-30 NOTE — Progress Notes (Signed)
    SUBJECTIVE:   CHIEF COMPLAINT / HPI:   Vascular dementia  tremor  high risk for self-neglect Patient had a fall in June 2025, which led to 54-month stay at skilled nursing facility for rehab.  Since discharge, he has been doing well.  He also recently was seen in our geriatric clinic, and diagnosed with vascular dementia.  He is able to hold a conversation, but is severely limited in his IADLs.  Completely reliant on family for finances, making food and hygiene.  He uses a rollator to ambulate.  Daughter is concerned because patient has tremor, and unsteady on his feet-given his recent fall, they are concerned about showering him.  They do not have a detachable showerhead, and he does not have anywhere to sit in his shower.  OBJECTIVE:   BP 128/85   Pulse (!) 107   Ht 5' 9 (1.753 m)   Wt 131 lb 3.2 oz (59.5 kg)   SpO2 99%   BMI 19.37 kg/m    General: NAD, pleasant, strong urine odor Cardio: RRR, no MRG. 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  His dementia/tremor make it difficult for him to stand in the shower and make difficult for caregivers to appropriately bathe him.  He has high risk for falling, and recently had a fall in June which led him to skilled nursing facility for rehab for 2 months.  I recommend shower chair/detachable showerhead to assist with his bathing. - DME order placed, routed to RN team - Flu shot today Tachycardia Noted today.  Regular rate and rhythm on auscultation.  Asymptomatic.  Suspect component of deconditioning.  Discussed ED/return precautions.     Gladis Church, DO [facility]

## 2024-02-03 ENCOUNTER — Other Ambulatory Visit: Payer: Self-pay | Admitting: Licensed Clinical Social Worker

## 2024-02-04 NOTE — Patient Outreach (Signed)
 Complex Care Management   Visit Note  02/03/2024  Name:  Matthew [name] MRN: [id number] DOB: 1940/06/03  Situation: Referral received for Complex Care Management related to Caregiver Stress I obtained verbal consent from Caregiver.  Visit completed with Caregiver  on the phone  Background:   Past Medical History:  Diagnosis Date   ADENOCARCINOMA, PROSTATE 10/25/2008   Qualifier: Diagnosis of  By: Bari MD, Theodoro   S/p robotic prostatectomy in 2010    ALLERGIC RHINITIS, SEASONAL 10/16/2009   Qualifier: Diagnosis of  By: Bari MD, [facility]     CHRONIC KIDNEY DISEASE STAGE 3 07/24/2006   Qualifier: Diagnosis of  By: JANEY PARODY   Followed by Dr. 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   Qualifier: Diagnosis of  By: Edsel Mems     Decreased hearing 01/15/2013   Dental cavity 04/13/2018   ESOPHAGEAL ULCER, WITH BLEEDING 10/25/2008   Qualifier: Diagnosis of  By: Bari MD, [place]     History of prostate cancer 04/13/2018   HYPERLIPIDEMIA 08/29/2009   Qualifier: Diagnosis of  By: Gardenia Pao   Statin therapy    HYPERTENSION, BENIGN SYSTEMIC 07/24/2006   Qualifier: Diagnosis of  By: JANEY PARODY     PAD (peripheral artery disease) (HCC)    SECONDARY HYPERPARATHYROIDISM 05/04/2007   Qualifier: Diagnosis of  By: [place], Bonnie     Social isolation 01/09/2024   TOBACCO ABUSE 08/10/2007   Qualifier: Diagnosis of  By: CLORIA CLEVELAND MD, MAKEECHA     Transfusion history 05/27/2008   Unstable angina (HCC) 01/15/2013    Assessment: Patient Reported Symptoms:  Cognitive Cognitive Status: Able to follow simple commands, Struggling with memory recall   Health Maintenance Behaviors: Annual physical exam  Neurological Neurological Review of Symptoms: No symptoms reported    HEENT HEENT Symptoms Reported: No symptoms reported      Cardiovascular Cardiovascular Symptoms Reported: No  symptoms reported Does patient have uncontrolled Hypertension?: No Cardiovascular Management Strategies: Medication therapy  Respiratory Respiratory Symptoms Reported: No symptoms reported    Endocrine Endocrine Symptoms Reported: No symptoms reported Is patient diabetic?: No    Gastrointestinal Gastrointestinal Symptoms Reported: No symptoms reported      Genitourinary Genitourinary Symptoms Reported: No symptoms reported    Integumentary Integumentary Symptoms Reported: No symptoms reported    Musculoskeletal Musculoskelatal Symptoms Reviewed: Limited mobility, Unsteady gait Musculoskeletal Management Strategies: Coping strategies, Routine screening, Medication therapy Falls in the past year?: Yes Number of falls in past year: 2 or more (3) Was there an injury with Fall?: Yes (Pt had hit his head after a fall this year) Fall Risk Category Calculator: 3 Patient Fall Risk Level: High Fall Risk Patient at Risk for Falls Due to: History of fall(s), Impaired mobility Fall risk Follow up: Falls prevention discussed  Psychosocial Psychosocial Symptoms Reported: No symptoms reported Additional Psychological Details: Patient smokes tobacco through pipe. Denies depression, anxiety, stress Behavioral Management Strategies: Coping strategies, Support system, Adequate rest Major Change/Loss/Stressor/Fears (CP): Medical condition, self Techniques to Cope with Loss/Stress/Change: Substance use (Tobacco Use) Quality of Family Relationships: helpful, supportive, involved Do you feel physically threatened by others?: No    02/04/2024    PHQ2-9 Depression Screening   Little interest or pleasure in doing things    Feeling down, depressed, or hopeless    PHQ-2 - Total Score    Trouble falling or staying asleep, or sleeping too much    Feeling tired or having little energy  Poor appetite or overeating     Feeling bad about yourself - or that you are a failure or have let yourself or your family  down    Trouble concentrating on things, such as reading the newspaper or watching television    Moving or speaking so slowly that other people could have noticed.  Or the opposite - being so fidgety or restless that you have been moving around a lot more than usual    Thoughts that you would be better off dead, or hurting yourself in some way    PHQ2-9 Total Score    If you checked off any problems, how difficult have these problems made it for you to do your work, take care of things at home, or get along with other people    Depression Interventions/Treatment      There were no vitals filed for this visit.  Medications Reviewed Today     Reviewed by Ezzard Rolin BIRCH, LCSW (Social Worker) on 02/04/24 at 1647  Med List Status: <None>   Medication Order Taking? Sig Documenting Provider Last Dose Status Informant  amLODipine  (NORVASC ) 10 MG tablet [id number]  TAKE 1 TABLET BY MOUTH DAILY Quillen, Michael, MD  Active Child, Pharmacy Records  aspirin  81 MG tablet [id number]  Take 81 mg by mouth daily. [provider]  Active Child, Pharmacy Records  atorvastatin  (LIPITOR) 40 MG tablet [id number]  TAKE 1 TABLET BY MOUTH DAILY Quillen, Michael, MD  Active Child, Pharmacy Records  latanoprost  (XALATAN ) 0.005 % ophthalmic solution [id number]  Place 1 drop into both eyes at bedtime. [provider]  Active Child, Pharmacy Records  metoprolol  succinate (TOPROL -XL) 25 MG 24 hr tablet [id number]  Take 1 tablet (25 mg total) by mouth daily. Quillen, Michael, MD  Active   senna (SENOKOT) 8.6 MG tablet [id number]  Take 1 tablet by mouth at bedtime. [provider]  Active             Recommendation:   Continue Current Plan of Care  Follow Up Plan:   Telephone follow-up in 1 month  Rolin Ezzard, LCSW [facility]  [facility], [facility] Clinical Social Worker Direct Dial: [phone]  Fax: [phone] Website: [email or web address] 4:53 PM

## 2024-02-04 NOTE — Patient Instructions (Signed)
 Visit Information  Thank you for taking time to visit with me today. Please don't hesitate to contact me if I can be of assistance to you before our next scheduled appointment.  Our next appointment is by telephone on 10/10 at 2 PM Please call the care guide team at [phone] if you need to cancel or reschedule your appointment.   Following is a copy of your care plan:   Goals Addressed             This Visit's Progress    LCSW VBCI Social Work Care Plan   On track    Problems:   Disease Management support and education needs related to Caregiver Stress  CSW Clinical Goal(s):   Over the next 90 days the Caregiver Patient will attend all scheduled medical appointments as evidenced by patient report and care team review of appointment completion in electronic MEDICAL RECORD NUMBER  demonstrate a reduction in symptoms related to Caregiver Stress .  Interventions:  Mental Health:  Evaluation of current treatment plan related to Caregiver Stress Active listening / Reflection utilized Caregiver stress acknowledged :Pt's two adult daughters assist in caring for patient Consideration on in-home help encouraged : options discussed Discussed caregiver resources and support: Pt's family is aware of local resources to assist with caregiver support. Family shared they were hoping to enroll pt in [facility]; however, pt refused, after d/c from SNF. He also does not want any aids coming into his residence Emotional Support Provided Motivational Interviewing employed Provided general psycho-education for mental health needs Reviewed mental health medications and discussed importance of compliance: Reports compliance. Daughters check in with pt daily to assist Solution-Focued Strategies employed: Application for LTC MA was submitted in July. Currently pending  Patient Goals/Self-Care Activities:  Continue taking your medication as prescribed.   Increase coping skills and stress reduction  Follow  up with DSS if LTC MA pends for over 90 days  Plan:   Telephone follow up appointment with care management team member scheduled for:  4 weeks        Please call the Suicide and Crisis Lifeline: 988 go to [facility] Urgent [facility] [address] [place] [phone]) call 911 if you are experiencing a Mental Health or Behavioral Health Crisis or need someone to talk to.  The patient verbalized understanding of instructions, educational materials, and care plan provided today and DECLINED offer to receive copy of patient instructions, educational materials, and care plan.   Rolin Ezzard HUGHS [facility] Health  [facility], [facility] Clinical Social Worker Direct Dial: [phone]  Fax: [phone] Website: [email or web address] 4:54 PM

## 2024-03-05 ENCOUNTER — Other Ambulatory Visit: Payer: Self-pay | Admitting: Licensed Clinical Social Worker

## 2024-03-15 NOTE — Patient Outreach (Signed)
 Complex Care Management   Visit Note  03/05/2024  Name:  Matthew [name] MRN: [id number] DOB: Dec 04, 1940  Situation: Referral received for Complex Care Management related to Caregiver Stress I obtained verbal consent from Caregiver.  Visit completed with Caregiver  on the phone  Background:   Past Medical History:  Diagnosis Date   ADENOCARCINOMA, PROSTATE 10/25/2008   Qualifier: Diagnosis of  By: Bari MD, Theodoro   S/p robotic prostatectomy in 2010    ALLERGIC RHINITIS, SEASONAL 10/16/2009   Qualifier: Diagnosis of  By: Bari MD, [facility]     CHRONIC KIDNEY DISEASE STAGE 3 07/24/2006   Qualifier: Diagnosis of  By: JANEY PARODY   Followed by Dr. 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   Qualifier: Diagnosis of  By: Edsel Mems     Decreased hearing 01/15/2013   Dental cavity 04/13/2018   ESOPHAGEAL ULCER, WITH BLEEDING 10/25/2008   Qualifier: Diagnosis of  By: Bari MD, [place]     History of prostate cancer 04/13/2018   HYPERLIPIDEMIA 08/29/2009   Qualifier: Diagnosis of  By: Gardenia Pao   Statin therapy    HYPERTENSION, BENIGN SYSTEMIC 07/24/2006   Qualifier: Diagnosis of  By: JANEY PARODY     PAD (peripheral artery disease)    SECONDARY HYPERPARATHYROIDISM 05/04/2007   Qualifier: Diagnosis of  By: [place], Bonnie     Social isolation 01/09/2024   TOBACCO ABUSE 08/10/2007   Qualifier: Diagnosis of  By: CLORIA CLEVELAND MD, MAKEECHA     Transfusion history 05/27/2008   Unstable angina (HCC) 01/15/2013    Assessment: Patient Reported Symptoms:  Cognitive Cognitive Status: No symptoms reported      Neurological Neurological Review of Symptoms: Not assessed    HEENT HEENT Symptoms Reported: Not assessed      Cardiovascular Cardiovascular Symptoms Reported: Not assessed    Respiratory Respiratory Symptoms Reported: Not assesed    Endocrine Endocrine Symptoms  Reported: Not assessed    Gastrointestinal Gastrointestinal Symptoms Reported: Not assessed      Genitourinary Genitourinary Symptoms Reported: Not assessed    Integumentary Integumentary Symptoms Reported: Not assessed    Musculoskeletal Musculoskelatal Symptoms Reviewed: Not assessed        Psychosocial Psychosocial Symptoms Reported: No symptoms reported Behavioral Management Strategies: Support system, Coping strategies, Adequate rest Major Change/Loss/Stressor/Fears (CP): Medical condition, self      03/15/2024    PHQ2-9 Depression Screening   Little interest or pleasure in doing things    Feeling down, depressed, or hopeless    PHQ-2 - Total Score    Trouble falling or staying asleep, or sleeping too much    Feeling tired or having little energy    Poor appetite or overeating     Feeling bad about yourself - or that you are a failure or have let yourself or your family down    Trouble concentrating on things, such as reading the newspaper or watching television    Moving or speaking so slowly that other people could have noticed.  Or the opposite - being so fidgety or restless that you have been moving around a lot more than usual    Thoughts that you would be better off dead, or hurting yourself in some way    PHQ2-9 Total Score    If you checked off any problems, how difficult have these problems made it for you to do your work, take care of things at home, or get along with other  people    Depression Interventions/Treatment      There were no vitals filed for this visit.  Medications Reviewed Today     Reviewed by Ezzard Rolin BIRCH, LCSW (Social Worker) on 03/15/24 at 1730  Med List Status: <None>   Medication Order Taking? Sig Documenting Provider Last Dose Status Informant  amLODipine  (NORVASC ) 10 MG tablet [id number]  TAKE 1 TABLET BY MOUTH DAILY Quillen, Michael, MD  Active Child, Pharmacy Records  aspirin  81 MG tablet [id number]  Take 81 mg by mouth daily.  [provider]  Active Child, Pharmacy Records  atorvastatin  (LIPITOR) 40 MG tablet [id number]  TAKE 1 TABLET BY MOUTH DAILY Quillen, Michael, MD  Active Child, Pharmacy Records  latanoprost  (XALATAN ) 0.005 % ophthalmic solution [id number]  Place 1 drop into both eyes at bedtime. [provider]  Active Child, Pharmacy Records  metoprolol  succinate (TOPROL -XL) 25 MG 24 hr tablet [id number]  Take 1 tablet (25 mg total) by mouth daily. Quillen, Michael, MD  Active   senna (SENOKOT) 8.6 MG tablet [id number]  Take 1 tablet by mouth at bedtime. [provider]  Active             Recommendation:   Continue Current Plan of Care  Follow Up Plan:   Telephone follow-up in 1 month  Rolin Ezzard, LCSW [facility] Health  [facility], [facility] Clinical Social Worker Direct Dial: [phone]  Fax: [phone] Website: [email or web address] 5:40 PM

## 2024-03-15 NOTE — Patient Instructions (Signed)
 Visit Information  Thank you for taking time to visit with me today. Please don't hesitate to contact me if I can be of assistance to you before our next scheduled appointment.  Your next care management appointment is by telephone on 11/21 at 2 PM  Please call the care guide team at [phone] if you need to cancel, schedule, or reschedule an appointment.   Please call the Suicide and Crisis Lifeline: 988 go to [facility] Urgent [facility] [address] [place] [phone]) call 911 if you are experiencing a Mental Health or Behavioral Health Crisis or need someone to talk to.  Rolin Kerns, LCSW [place]  [facility], [facility] Clinical Social Worker Direct Dial: [phone]  Fax: [phone] Website: [email or web address] 5:41 PM

## 2024-03-31 ENCOUNTER — Encounter: Payer: Self-pay | Admitting: Family Medicine

## 2024-04-16 ENCOUNTER — Encounter: Payer: Self-pay | Admitting: Licensed Clinical Social Worker

## 2024-04-16 ENCOUNTER — Other Ambulatory Visit: Payer: Self-pay | Admitting: Licensed Clinical Social Worker

## 2024-04-16 NOTE — Patient Instructions (Signed)
 Matthew [name] - I am sorry I was unable to reach you today for our scheduled appointment. I work with Alba Sharper, MD and am calling to support your healthcare needs. Please contact me at [phone] at your earliest convenience. I look forward to speaking with you soon.   Thank you,  Rolin Kerns, LCSW [place]  [facility], [facility] Clinical Social Worker Direct Dial: [phone]  Fax: [phone] Website: [email or web address] 4:41 PM

## 2024-05-02 NOTE — Patient Outreach (Signed)
 Complex Care Management   Visit Note  04/16/2024  Name:  Matthew [name] MRN: [id number] DOB: 05-Mar-1941  Situation: Referral received for Complex Care Management related to Caregiver Stress I obtained verbal consent from Caregiver.  Visit completed with Caregiver  on the phone  Background:   Past Medical History:  Diagnosis Date   ADENOCARCINOMA, PROSTATE 10/25/2008   Qualifier: Diagnosis of  By: Bari MD, Theodoro   S/p robotic prostatectomy in 2010    ALLERGIC RHINITIS, SEASONAL 10/16/2009   Qualifier: Diagnosis of  By: Bari MD, [facility]     CHRONIC KIDNEY DISEASE STAGE 3 07/24/2006   Qualifier: Diagnosis of  By: JANEY PARODY   Followed by Dr. 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   Qualifier: Diagnosis of  By: Edsel Mems     Decreased hearing 01/15/2013   Dental cavity 04/13/2018   ESOPHAGEAL ULCER, WITH BLEEDING 10/25/2008   Qualifier: Diagnosis of  By: Bari MD, [place]     History of prostate cancer 04/13/2018   HYPERLIPIDEMIA 08/29/2009   Qualifier: Diagnosis of  By: Gardenia Pao   Statin therapy    HYPERTENSION, BENIGN SYSTEMIC 07/24/2006   Qualifier: Diagnosis of  By: JANEY PARODY     PAD (peripheral artery disease)    SECONDARY HYPERPARATHYROIDISM 05/04/2007   Qualifier: Diagnosis of  By: Jordan, Bonnie     Social isolation 01/09/2024   TOBACCO ABUSE 08/10/2007   Qualifier: Diagnosis of  By: CLORIA CLEVELAND MD, MAKEECHA     Transfusion history 05/27/2008   Unstable angina (HCC) 01/15/2013    Assessment: Patient Reported Symptoms:  Cognitive Cognitive Status: No symptoms reported      Neurological Neurological Review of Symptoms: No symptoms reported, Vision changes Neurological Management Strategies: Routine screening Neurological Comment: Pt had cataract removed from left eye, no complications or concerns reported  HEENT HEENT Symptoms Reported: No  symptoms reported HEENT Management Strategies: Routine screening HEENT Comment: Pt had cataract removed from left eye, no complications or concerns reported    Cardiovascular Cardiovascular Symptoms Reported: No symptoms reported    Respiratory Respiratory Symptoms Reported: No symptoms reported    Endocrine Endocrine Symptoms Reported: No symptoms reported    Gastrointestinal Gastrointestinal Symptoms Reported: Not assessed      Genitourinary Genitourinary Symptoms Reported: Not assessed    Integumentary Integumentary Symptoms Reported: Not assessed    Musculoskeletal Musculoskelatal Symptoms Reviewed: Unsteady gait, Limited mobility, No symptoms reported   Falls in the past year?: No Number of falls in past year: 1 or less Was there an injury with Fall?: No Fall Risk Category Calculator: 0 Patient Fall Risk Level: Low Fall Risk    Psychosocial Psychosocial Symptoms Reported: No symptoms reported Behavioral Management Strategies: Support system, Coping strategies, Adequate rest Major Change/Loss/Stressor/Fears (CP): Denies Techniques to Cope with Loss/Stress/Change: Diversional activities Quality of Family Relationships: involved, supportive, helpful    05/02/2024    PHQ2-9 Depression Screening   Little interest or pleasure in doing things    Feeling down, depressed, or hopeless    PHQ-2 - Total Score    Trouble falling or staying asleep, or sleeping too much    Feeling tired or having little energy    Poor appetite or overeating     Feeling bad about yourself - or that you are a failure or have let yourself or your family down    Trouble concentrating on things, such as reading the newspaper or watching television  Moving or speaking so slowly that other people could have noticed.  Or the opposite - being so fidgety or restless that you have been moving around a lot more than usual    Thoughts that you would be better off dead, or hurting yourself in some way    PHQ2-9  Total Score    If you checked off any problems, how difficult have these problems made it for you to do your work, take care of things at home, or get along with other people    Depression Interventions/Treatment      There were no vitals filed for this visit.    Medications Reviewed Today     Reviewed by Hayzen Lorenson D, LCSW (Social Worker) on 05/02/24 at 1454  Med List Status: <None>   Medication Order Taking? Sig Documenting Provider Last Dose Status Informant  amLODipine  (NORVASC ) 10 MG tablet [id number]  TAKE 1 TABLET BY MOUTH DAILY Quillen, Michael, MD  Active Child, Pharmacy Records  aspirin  81 MG tablet [id number]  Take 81 mg by mouth daily. [provider]  Active Child, Pharmacy Records  atorvastatin  (LIPITOR) 40 MG tablet [id number]  TAKE 1 TABLET BY MOUTH DAILY Quillen, Michael, MD  Active Child, Pharmacy Records  latanoprost  (XALATAN ) 0.005 % ophthalmic solution [id number]  Place 1 drop into both eyes at bedtime. [provider]  Active Child, Pharmacy Records  metoprolol  succinate (TOPROL -XL) 25 MG 24 hr tablet [id number]  Take 1 tablet (25 mg total) by mouth daily. Quillen, Michael, MD  Active   senna (SENOKOT) 8.6 MG tablet [id number]  Take 1 tablet by mouth at bedtime. [provider]  Active             Recommendation:   PCP Follow-up on 05/13/24  Follow Up Plan:   Closing From:  Complex Care Management Patient has met all care management goals. Care Management case will be closed. Patient has been provided contact information should new needs arise.   Rolin Ezzard HUGHS [facility]  [facility], [facility] Clinical Social Worker Direct Dial: [phone]  Fax: [phone] Website: [email or web address] 3:01 PM

## 2024-05-02 NOTE — Patient Instructions (Signed)
 Visit Information  Thank you for taking time to visit with me today. Please don't hesitate to contact me if I can be of assistance to you before our next scheduled appointment.  Closing From: Complex Care Management. Patient has met all care management goals. Care Management case will be closed. Patient has been provided contact information should new needs arise.   Please call the care guide team at [phone] if you need to cancel, schedule, or reschedule an appointment.   Please call the Suicide and Crisis Lifeline: 988 go to [facility] [address], [place] [phone]) call 911 if you are experiencing a Mental Health or Behavioral Health Crisis or need someone to talk to.  Rolin Kerns, LCSW [place]  [facility], [facility] Clinical Social Worker Direct Dial: [phone]  Fax: [phone] Website: [email or web address] 3:01 PM

## 2024-05-13 ENCOUNTER — Encounter

## 2024-05-13 NOTE — Progress Notes (Signed)
-  This encounter was created in error - please disregard. - 05/13/2024 AWV was never completed.  Zanae Kuehnle N. Tomie, LPN [employer]

## 2024-05-14 ENCOUNTER — Telehealth: Admitting: Licensed Clinical Social Worker

## 2024-07-29 ENCOUNTER — Encounter
